# Patient Record
Sex: Female | Born: 1975 | Race: White | Hispanic: No | Marital: Married | Smoking: Never smoker
Health system: Southern US, Community
[De-identification: ages and names within clinical notes are randomized; demographics above are authoritative.]

## PROBLEM LIST (undated history)

## (undated) DIAGNOSIS — F32A Depression, unspecified: Secondary | ICD-10-CM

## (undated) DIAGNOSIS — F329 Major depressive disorder, single episode, unspecified: Secondary | ICD-10-CM

## (undated) DIAGNOSIS — R3915 Urgency of urination: Secondary | ICD-10-CM

## (undated) DIAGNOSIS — N289 Disorder of kidney and ureter, unspecified: Secondary | ICD-10-CM

## (undated) DIAGNOSIS — D219 Benign neoplasm of connective and other soft tissue, unspecified: Secondary | ICD-10-CM

## (undated) DIAGNOSIS — J189 Pneumonia, unspecified organism: Secondary | ICD-10-CM

## (undated) DIAGNOSIS — R350 Frequency of micturition: Secondary | ICD-10-CM

## (undated) DIAGNOSIS — J40 Bronchitis, not specified as acute or chronic: Secondary | ICD-10-CM

## (undated) DIAGNOSIS — Z9889 Other specified postprocedural states: Secondary | ICD-10-CM

## (undated) DIAGNOSIS — Z87442 Personal history of urinary calculi: Secondary | ICD-10-CM

## (undated) DIAGNOSIS — N201 Calculus of ureter: Secondary | ICD-10-CM

## (undated) DIAGNOSIS — R112 Nausea with vomiting, unspecified: Secondary | ICD-10-CM

## (undated) DIAGNOSIS — R011 Cardiac murmur, unspecified: Secondary | ICD-10-CM

## (undated) DIAGNOSIS — R109 Unspecified abdominal pain: Secondary | ICD-10-CM

## (undated) DIAGNOSIS — F419 Anxiety disorder, unspecified: Secondary | ICD-10-CM

## (undated) HISTORY — PX: DILATION AND CURETTAGE OF UTERUS: SHX78

## (undated) HISTORY — PX: FRACTURE SURGERY: SHX138

## (undated) HISTORY — PX: OTHER SURGICAL HISTORY: SHX169

---
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
## 2005-01-02 ENCOUNTER — Observation Stay: Payer: Self-pay | Admitting: Obstetrics and Gynecology

## 2005-04-17 ENCOUNTER — Inpatient Hospital Stay: Payer: Self-pay | Admitting: Obstetrics and Gynecology

## 2006-07-31 HISTORY — PX: OTHER SURGICAL HISTORY: SHX169

## 2007-05-27 ENCOUNTER — Ambulatory Visit: Payer: Self-pay | Admitting: Obstetrics and Gynecology

## 2008-09-15 ENCOUNTER — Ambulatory Visit: Payer: Self-pay | Admitting: Obstetrics and Gynecology

## 2008-09-16 ENCOUNTER — Inpatient Hospital Stay: Payer: Self-pay | Admitting: Obstetrics and Gynecology

## 2009-05-06 ENCOUNTER — Ambulatory Visit: Payer: Self-pay | Admitting: Urology

## 2009-05-07 ENCOUNTER — Ambulatory Visit: Payer: Self-pay | Admitting: Urology

## 2009-05-13 ENCOUNTER — Ambulatory Visit: Payer: Self-pay | Admitting: Urology

## 2009-06-16 ENCOUNTER — Ambulatory Visit: Payer: Self-pay | Admitting: Urology

## 2009-06-17 ENCOUNTER — Ambulatory Visit: Payer: Self-pay | Admitting: Urology

## 2009-06-28 ENCOUNTER — Ambulatory Visit: Payer: Self-pay | Admitting: Urology

## 2010-01-19 ENCOUNTER — Emergency Department: Payer: Self-pay | Admitting: Emergency Medicine

## 2010-07-31 HISTORY — PX: EXTRACORPOREAL SHOCK WAVE LITHOTRIPSY: SHX1557

## 2011-03-18 ENCOUNTER — Emergency Department (HOSPITAL_COMMUNITY): Payer: BC Managed Care – PPO

## 2011-03-18 ENCOUNTER — Emergency Department (HOSPITAL_COMMUNITY)
Admission: EM | Admit: 2011-03-18 | Discharge: 2011-03-19 | Disposition: A | Discharge status: Home / Self Care | Payer: BC Managed Care – PPO | Attending: Emergency Medicine | Admitting: Emergency Medicine

## 2011-03-18 DIAGNOSIS — N201 Calculus of ureter: Secondary | ICD-10-CM | POA: Insufficient documentation

## 2011-03-18 DIAGNOSIS — Z79899 Other long term (current) drug therapy: Secondary | ICD-10-CM | POA: Insufficient documentation

## 2011-03-18 DIAGNOSIS — R3911 Hesitancy of micturition: Secondary | ICD-10-CM | POA: Insufficient documentation

## 2011-03-18 DIAGNOSIS — R109 Unspecified abdominal pain: Secondary | ICD-10-CM | POA: Insufficient documentation

## 2011-03-18 DIAGNOSIS — M549 Dorsalgia, unspecified: Secondary | ICD-10-CM | POA: Insufficient documentation

## 2011-03-18 DIAGNOSIS — R112 Nausea with vomiting, unspecified: Secondary | ICD-10-CM | POA: Insufficient documentation

## 2011-03-18 LAB — URINALYSIS, ROUTINE W REFLEX MICROSCOPIC
Glucose, UA: NEGATIVE mg/dL
Leukocytes, UA: NEGATIVE
Specific Gravity, Urine: 1.026 (ref 1.005–1.030)
pH: 6 (ref 5.0–8.0)

## 2011-03-18 LAB — URINE MICROSCOPIC-ADD ON

## 2011-03-18 LAB — POCT I-STAT, CHEM 8
Calcium, Ion: 1.14 mmol/L (ref 1.12–1.32)
Chloride: 106 mEq/L (ref 96–112)
Glucose, Bld: 114 mg/dL — ABNORMAL HIGH (ref 70–99)
HCT: 36 % (ref 36.0–46.0)
Hemoglobin: 12.2 g/dL (ref 12.0–15.0)
TCO2: 25 mmol/L (ref 0–100)

## 2011-03-21 ENCOUNTER — Ambulatory Visit: Payer: Self-pay | Admitting: Urology

## 2011-03-22 ENCOUNTER — Observation Stay: Payer: Self-pay | Admitting: Urology

## 2011-03-25 ENCOUNTER — Emergency Department (HOSPITAL_COMMUNITY)
Admission: EM | Admit: 2011-03-25 | Discharge: 2011-03-25 | Disposition: A | Discharge status: Home / Self Care | Payer: BC Managed Care – PPO | Attending: Emergency Medicine | Admitting: Emergency Medicine

## 2011-03-25 ENCOUNTER — Emergency Department (HOSPITAL_COMMUNITY): Payer: BC Managed Care – PPO

## 2011-03-25 DIAGNOSIS — Z79899 Other long term (current) drug therapy: Secondary | ICD-10-CM | POA: Insufficient documentation

## 2011-03-25 DIAGNOSIS — R109 Unspecified abdominal pain: Secondary | ICD-10-CM | POA: Insufficient documentation

## 2011-03-25 DIAGNOSIS — R112 Nausea with vomiting, unspecified: Secondary | ICD-10-CM | POA: Insufficient documentation

## 2011-03-25 LAB — CBC
MCH: 30 pg (ref 26.0–34.0)
MCV: 87.1 fL (ref 78.0–100.0)
Platelets: 248 10*3/uL (ref 150–400)
RDW: 13.5 % (ref 11.5–15.5)
WBC: 6.4 10*3/uL (ref 4.0–10.5)

## 2011-03-25 LAB — BASIC METABOLIC PANEL
Calcium: 9.6 mg/dL (ref 8.4–10.5)
Chloride: 102 mEq/L (ref 96–112)
Creatinine, Ser: 1.02 mg/dL (ref 0.50–1.10)
GFR calc Af Amer: >60 mL/min (ref >60)
Sodium: 139 mEq/L (ref 135–145)

## 2011-03-25 LAB — URINALYSIS, ROUTINE W REFLEX MICROSCOPIC
Ketones, ur: 15 mg/dL — AB
Nitrite: NEGATIVE
Urobilinogen, UA: 1 mg/dL (ref 0.0–1.0)

## 2011-03-25 LAB — DIFFERENTIAL
Basophils Relative: 0 % (ref 0–1)
Eosinophils Absolute: 0.1 10*3/uL (ref 0.0–0.7)
Eosinophils Relative: 1 % (ref 0–5)
Lymphs Abs: 1.8 10*3/uL (ref 0.7–4.0)

## 2011-03-25 LAB — PREGNANCY, URINE: Preg Test, Ur: NEGATIVE

## 2011-09-22 ENCOUNTER — Ambulatory Visit: Payer: Self-pay | Admitting: Obstetrics and Gynecology

## 2011-10-06 ENCOUNTER — Ambulatory Visit: Payer: Self-pay | Admitting: Otolaryngology

## 2012-04-05 ENCOUNTER — Emergency Department: Payer: Self-pay | Admitting: Emergency Medicine

## 2012-04-05 LAB — COMPREHENSIVE METABOLIC PANEL
Alkaline Phosphatase: 83 U/L (ref 50–136)
BUN: 12 mg/dL (ref 7–18)
Bilirubin,Total: 0.8 mg/dL (ref 0.2–1.0)
Co2: 29 mmol/L (ref 21–32)
Creatinine: 0.81 mg/dL (ref 0.60–1.30)
EGFR (Non-African Amer.): >60
Osmolality: 279 (ref 275–301)
SGPT (ALT): 16 U/L (ref 12–78)
Sodium: 140 mmol/L (ref 136–145)
Total Protein: 7.2 g/dL (ref 6.4–8.2)

## 2012-04-05 LAB — URINALYSIS, COMPLETE
Bilirubin,UR: NEGATIVE
Blood: NEGATIVE
Glucose,UR: NEGATIVE mg/dL (ref 0–75)
Ketone: NEGATIVE
Nitrite: NEGATIVE
Ph: 6 (ref 4.5–8.0)
Specific Gravity: 1.018 (ref 1.003–1.030)
Squamous Epithelial: 1

## 2012-04-05 LAB — CBC
HGB: 12 g/dL (ref 12.0–16.0)
MCH: 26.5 pg (ref 26.0–34.0)
MCHC: 33 g/dL (ref 32.0–36.0)
MCV: 80 fL (ref 80–100)
Platelet: 293 10*3/uL (ref 150–440)
RBC: 4.53 10*6/uL (ref 3.80–5.20)
RDW: 15.8 % — ABNORMAL HIGH (ref 11.5–14.5)

## 2012-04-05 LAB — PREGNANCY, URINE: Pregnancy Test, Urine: NEGATIVE m[IU]/mL

## 2012-04-09 ENCOUNTER — Other Ambulatory Visit: Payer: Self-pay | Admitting: Urology

## 2012-04-09 ENCOUNTER — Encounter (HOSPITAL_BASED_OUTPATIENT_CLINIC_OR_DEPARTMENT_OTHER): Payer: Self-pay | Admitting: *Deleted

## 2012-04-09 NOTE — Progress Notes (Signed)
NPO AFTER MN WITH EXCEPTION CLEAR LIQUIDS UNTIL 0900 (NO CREAM/ MILK PRODUCTS). ARRIVES AT 1345. NEEDS HG AND URINE PREG. WILL TAKE DILAUDID AND ZOFRAN IF NEEDED W/ SIPS OF WATER AFTER 0900.

## 2012-04-10 DIAGNOSIS — N201 Calculus of ureter: Secondary | ICD-10-CM

## 2012-04-10 NOTE — H&P (Signed)
History of Present Illness  36 y/o white female with h/o nephrolithiasis presents for acute evaluation of right flank pain.  She reports sudden onset of right flank pain Friday 04/05/12.  She was evaluted at the ER and KUB demonstrated a right distal ureteral stone at the UVJ.  She has not passed the stone to her knowledge and has continued with intermittent right flank pain into the RLQ abdomen.  She also reports increased frequency, urgency with frequent, small volume voids and dysuria.  She denies gross hematuria, fever or chills.  Her prior stones have been on the left.  She has never passed a stone without intervention.  She is taking Flomax 0.4mg  po qd for MET.  Dilaudid 2mg  is controlling her pain.   Past Medical History Problems  1. History of  Bipolar Disorder 296.00 2. History of  Depression 311 3. History of  Nephrolithiasis V13.01  Surgical History Problems  1. History of  Cesarean Section 2. History of  Cesarean Section 3. History of  Cystoscopy With Ureteroscopy Left 4. History of  Dilation And Curettage 5. History of  Jaw Surgery 6. History of  Lithotripsy  Current Meds 1. HYDROmorphone HCl 2 MG Oral Tablet; Therapy: 07Sep2013 to 2. LaMICtal 200 MG Oral Tablet; TAKE 1 TABLET DAILY; Therapy: (Recorded:10Sep2012) to 3. Latuda 40 MG Oral Tablet; Therapy: 24Jun2013 to 4. Lidocaine Viscous 2 % Mouth/Throat Solution; Therapy: 29Apr2013 to 5. Ondansetron HCl 8 MG Oral Tablet; Therapy: 06Sep2013 to 6. Sertraline HCl 100 MG Oral Tablet; 1 tablet daily; Therapy: (Recorded:10Sep2012) to 7. Tamsulosin HCl 0.4 MG Oral Capsule; Therapy: 06Sep2013 to  Allergies Medication  1. Cephalexin CAPS 2. Sulfamethoxazole-TMP DS TABS 3. Amoxicillin TABS 4. Darvocet-N 100 TABS 5. Penicillins 6. Percocet TABS  Family History Problems  1. Family history of  Cancer 2. Family history of  Family Health Status Number Of Children 2 sons 3. Paternal history of  Nephrolithiasis 4. Paternal  grandfather's history of  Nephrolithiasis  Social History Problems  1. Caffeine Use 1 a day 2. Marital History - Currently Married 3. Never A Smoker 4. 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 History of  Alcohol Use 6. History of  Tobacco Use 305.1  Review of Systems  Genitourinary: urinary hesitancy and hematuria.  Gastrointestinal: nausea, vomiting, flank pain and constipation.  Constitutional: night sweats.  Musculoskeletal: back pain.  Psychiatric: depression.    Vitals Vital Signs  BMI Calculated: 23 BSA Calculated: 1.56 Height: 5 ft 2 in Weight: 125 lb  Blood Pressure: 122 / 80 Temperature: 98.2 F Heart Rate: 76  Physical Exam Constitutional: Well nourished and well developed . No acute distress.  ENT:. The ears and nose are normal in appearance.  Neck: The appearance of the neck is normal and no neck mass is present.  Pulmonary: No respiratory distress and normal respiratory rhythm and effort.  Cardiovascular: Heart rate and rhythm are normal . No peripheral edema.  Abdomen: The abdomen is soft and nontender. No masses are palpated. Mild tenderness in the LLQ is present. mild left CVA tenderness. No hernias are palpable. No hepatosplenomegaly noted.  Skin: Normal skin turgor, no visible rash and no visible skin lesions.  Neuro/Psych:. Mood and affect are appropriate.   Impression: She has a small 3 mm stone in her distal right ureter. The stone does not appear to be progressing with medical expulsive therapy and she continues to have significant pain. The treatment options were therefore discussed and she would be most appropriately managed with ureteroscopic  extraction of her stone. This procedure was therefore discussed with her in detail including its risks and complications, the alternatives, the probability of success and the anticipated postoperative course as well as the possible need for a stent postoperatively. She understands and has elected to  proceed.  Plan: She will be scheduled for right ureteroscopy and stone extraction with possible stent placement.

## 2012-04-11 ENCOUNTER — Ambulatory Visit (HOSPITAL_BASED_OUTPATIENT_CLINIC_OR_DEPARTMENT_OTHER): Admission: RE | Admit: 2012-04-11 | Payer: BC Managed Care – PPO | Source: Ambulatory Visit | Admitting: Urology

## 2012-04-11 ENCOUNTER — Encounter (HOSPITAL_BASED_OUTPATIENT_CLINIC_OR_DEPARTMENT_OTHER): Admission: RE | Payer: Self-pay | Source: Ambulatory Visit

## 2012-04-11 HISTORY — DX: Frequency of micturition: R35.0

## 2012-04-11 HISTORY — DX: Urgency of urination: R39.15

## 2012-04-11 HISTORY — DX: Unspecified abdominal pain: R10.9

## 2012-04-11 HISTORY — DX: Personal history of urinary calculi: Z87.442

## 2012-04-11 HISTORY — DX: Calculus of ureter: N20.1

## 2012-04-11 HISTORY — DX: Major depressive disorder, single episode, unspecified: F32.9

## 2012-04-11 HISTORY — DX: Nausea with vomiting, unspecified: R11.2

## 2012-04-11 HISTORY — DX: Depression, unspecified: F32.A

## 2012-04-11 HISTORY — DX: Other specified postprocedural states: Z98.890

## 2012-04-11 SURGERY — CYSTOSCOPY, WITH CALCULUS REMOVAL USING BASKET
Anesthesia: Choice | Laterality: Right

## 2012-06-02 ENCOUNTER — Emergency Department (HOSPITAL_COMMUNITY): Payer: BC Managed Care – PPO | Admitting: *Deleted

## 2012-06-02 ENCOUNTER — Encounter (HOSPITAL_COMMUNITY): Payer: Self-pay | Admitting: *Deleted

## 2012-06-02 ENCOUNTER — Encounter (HOSPITAL_COMMUNITY): Admission: EM | Disposition: A | Discharge status: Home / Self Care | Payer: Self-pay | Source: Home / Self Care

## 2012-06-02 ENCOUNTER — Emergency Department (HOSPITAL_COMMUNITY)
Admission: EM | Admit: 2012-06-02 | Discharge: 2012-06-02 | Disposition: A | Discharge status: Home / Self Care | Payer: BC Managed Care – PPO | Attending: Urology | Admitting: Urology

## 2012-06-02 ENCOUNTER — Emergency Department (HOSPITAL_COMMUNITY): Payer: BC Managed Care – PPO

## 2012-06-02 DIAGNOSIS — N390 Urinary tract infection, site not specified: Secondary | ICD-10-CM

## 2012-06-02 DIAGNOSIS — N201 Calculus of ureter: Secondary | ICD-10-CM | POA: Insufficient documentation

## 2012-06-02 DIAGNOSIS — N1330 Unspecified hydronephrosis: Secondary | ICD-10-CM | POA: Insufficient documentation

## 2012-06-02 HISTORY — PX: CYSTOSCOPY WITH RETROGRADE PYELOGRAM, URETEROSCOPY AND STENT PLACEMENT: SHX5789

## 2012-06-02 LAB — URINALYSIS, ROUTINE W REFLEX MICROSCOPIC
Glucose, UA: NEGATIVE mg/dL
Specific Gravity, Urine: 1.016 (ref 1.005–1.030)
pH: 6 (ref 5.0–8.0)

## 2012-06-02 LAB — CBC WITH DIFFERENTIAL/PLATELET
Basophils Absolute: 0.1 10*3/uL (ref 0.0–0.1)
Basophils Relative: 1 % (ref 0–1)
Eosinophils Absolute: 0.1 10*3/uL (ref 0.0–0.7)
Eosinophils Relative: 1 % (ref 0–5)
HCT: 37.4 % (ref 36.0–46.0)
Lymphocytes Relative: 16 % (ref 12–46)
MCH: 26.4 pg (ref 26.0–34.0)
MCHC: 32.9 g/dL (ref 30.0–36.0)
MCV: 80.3 fL (ref 78.0–100.0)
Monocytes Absolute: 1 10*3/uL (ref 0.1–1.0)
RDW: 16 % — ABNORMAL HIGH (ref 11.5–15.5)

## 2012-06-02 LAB — COMPREHENSIVE METABOLIC PANEL
AST: 19 U/L (ref 0–37)
CO2: 24 mEq/L (ref 19–32)
Calcium: 9.4 mg/dL (ref 8.4–10.5)
Creatinine, Ser: 0.8 mg/dL (ref 0.50–1.10)
GFR calc non Af Amer: >90 mL/min (ref >90)
Total Protein: 7 g/dL (ref 6.0–8.3)

## 2012-06-02 SURGERY — CYSTOURETEROSCOPY, WITH RETROGRADE PYELOGRAM AND STENT INSERTION
Anesthesia: General | Site: Ureter | Laterality: Right | Wound class: Clean Contaminated

## 2012-06-02 MED ORDER — ONDANSETRON HCL 4 MG/2ML IJ SOLN
INTRAMUSCULAR | Status: DC | PRN
  Administered 2012-06-02: 4 mg via INTRAVENOUS

## 2012-06-02 MED ORDER — ACETAMINOPHEN 10 MG/ML IV SOLN
INTRAVENOUS | Status: AC
  Filled 2012-06-02: qty 100

## 2012-06-02 MED ORDER — MEPERIDINE HCL 50 MG/ML IJ SOLN: 6.2500 mg | INTRAMUSCULAR | Status: DC | PRN

## 2012-06-02 MED ORDER — HYDROMORPHONE HCL PF 1 MG/ML IJ SOLN
INTRAMUSCULAR | Status: AC
  Filled 2012-06-02: qty 1

## 2012-06-02 MED ORDER — PROPOFOL 10 MG/ML IV EMUL
INTRAVENOUS | Status: DC | PRN
  Administered 2012-06-02: 200 mg via INTRAVENOUS

## 2012-06-02 MED ORDER — ONDANSETRON HCL 4 MG PO TABS: 4.0000 mg | ORAL_TABLET | ORAL | Status: DC | PRN

## 2012-06-02 MED ORDER — PROMETHAZINE HCL 25 MG/ML IJ SOLN: 6.2500 mg | INTRAMUSCULAR | Status: DC | PRN

## 2012-06-02 MED ORDER — CIPROFLOXACIN HCL 500 MG PO TABS: 500.0000 mg | ORAL_TABLET | Freq: Two times a day (BID) | ORAL | Status: DC

## 2012-06-02 MED ORDER — LIDOCAINE HCL (CARDIAC) 20 MG/ML IV SOLN
INTRAVENOUS | Status: DC | PRN
  Administered 2012-06-02: 75 mg via INTRAVENOUS

## 2012-06-02 MED ORDER — CIPROFLOXACIN IN D5W 400 MG/200ML IV SOLN
400.0000 mg | Freq: Once | INTRAVENOUS | Status: AC
  Administered 2012-06-02: 400 mg via INTRAVENOUS
  Filled 2012-06-02: qty 200

## 2012-06-02 MED ORDER — FENTANYL CITRATE 0.05 MG/ML IJ SOLN
100.0000 ug | Freq: Once | INTRAMUSCULAR | Status: AC
  Administered 2012-06-02: 100 ug via INTRAVENOUS
  Filled 2012-06-02: qty 2

## 2012-06-02 MED ORDER — CISATRACURIUM BESYLATE (PF) 10 MG/5ML IV SOLN
INTRAVENOUS | Status: DC | PRN
  Administered 2012-06-02: 8 mg via INTRAVENOUS

## 2012-06-02 MED ORDER — HYDROMORPHONE HCL 2 MG PO TABS
ORAL_TABLET | ORAL | Status: AC
  Filled 2012-06-02: qty 1

## 2012-06-02 MED ORDER — LIDOCAINE HCL 2 % EX GEL
CUTANEOUS | Status: DC | PRN
  Administered 2012-06-02: 1

## 2012-06-02 MED ORDER — LIDOCAINE HCL 2 % EX GEL
CUTANEOUS | Status: AC
  Filled 2012-06-02: qty 10

## 2012-06-02 MED ORDER — PHENAZOPYRIDINE HCL 200 MG PO TABS
ORAL_TABLET | ORAL | Status: AC
  Filled 2012-06-02: qty 1

## 2012-06-02 MED ORDER — BELLADONNA ALKALOIDS-OPIUM 16.2-60 MG RE SUPP
RECTAL | Status: DC | PRN
  Administered 2012-06-02: 1 via RECTAL

## 2012-06-02 MED ORDER — IOHEXOL 300 MG/ML  SOLN
INTRAMUSCULAR | Status: AC
  Filled 2012-06-02: qty 1

## 2012-06-02 MED ORDER — PHENAZOPYRIDINE HCL 200 MG PO TABS
200.0000 mg | ORAL_TABLET | Freq: Three times a day (TID) | ORAL | Status: AC
  Administered 2012-06-02: 200 mg via ORAL

## 2012-06-02 MED ORDER — SUCCINYLCHOLINE CHLORIDE 20 MG/ML IJ SOLN
INTRAMUSCULAR | Status: DC | PRN
  Administered 2012-06-02: 100 mg via INTRAVENOUS

## 2012-06-02 MED ORDER — SCOPOLAMINE 1 MG/3DAYS TD PT72
MEDICATED_PATCH | TRANSDERMAL | Status: DC | PRN
  Administered 2012-06-02: 1 via TRANSDERMAL

## 2012-06-02 MED ORDER — ONDANSETRON HCL 4 MG/2ML IJ SOLN
4.0000 mg | Freq: Once | INTRAMUSCULAR | Status: AC
  Administered 2012-06-02: 4 mg via INTRAVENOUS
  Filled 2012-06-02: qty 2

## 2012-06-02 MED ORDER — LACTATED RINGERS IV SOLN: INTRAVENOUS | Status: DC

## 2012-06-02 MED ORDER — ONDANSETRON HCL 4 MG/2ML IJ SOLN
INTRAMUSCULAR | Status: AC
  Filled 2012-06-02: qty 2

## 2012-06-02 MED ORDER — SENNOSIDES-DOCUSATE SODIUM 8.6-50 MG PO TABS: 1.0000 | ORAL_TABLET | Freq: Two times a day (BID) | ORAL | Status: DC

## 2012-06-02 MED ORDER — HYDROMORPHONE HCL 2 MG PO TABS: 2.0000 mg | ORAL_TABLET | ORAL | Status: DC | PRN

## 2012-06-02 MED ORDER — FENTANYL CITRATE 0.05 MG/ML IJ SOLN
INTRAMUSCULAR | Status: DC | PRN
Start: 2012-06-02 — End: 2012-06-02
  Administered 2012-06-02: 50 ug via INTRAVENOUS
  Administered 2012-06-02: 150 ug via INTRAVENOUS
  Administered 2012-06-02: 100 ug via INTRAVENOUS

## 2012-06-02 MED ORDER — BELLADONNA ALKALOIDS-OPIUM 16.2-60 MG RE SUPP
RECTAL | Status: AC
  Filled 2012-06-02: qty 1

## 2012-06-02 MED ORDER — ACETAMINOPHEN 10 MG/ML IV SOLN
INTRAVENOUS | Status: DC | PRN
  Administered 2012-06-02: 1000 mg via INTRAVENOUS

## 2012-06-02 MED ORDER — IOHEXOL 350 MG/ML SOLN: INTRAVENOUS | Status: DC | PRN

## 2012-06-02 MED ORDER — OXYBUTYNIN CHLORIDE 5 MG PO TABS
5.0000 mg | ORAL_TABLET | Freq: Four times a day (QID) | ORAL | Status: DC | PRN
Start: 2012-06-02 — End: 2014-10-01

## 2012-06-02 MED ORDER — NEOSTIGMINE METHYLSULFATE 1 MG/ML IJ SOLN
INTRAMUSCULAR | Status: DC | PRN
  Administered 2012-06-02: 5 mg via INTRAVENOUS

## 2012-06-02 MED ORDER — SODIUM CHLORIDE 0.9 % IR SOLN
Status: DC | PRN
  Administered 2012-06-02: 3000 mL via INTRAVESICAL

## 2012-06-02 MED ORDER — FENTANYL CITRATE 0.05 MG/ML IJ SOLN
INTRAMUSCULAR | Status: AC
  Filled 2012-06-02: qty 2

## 2012-06-02 MED ORDER — SCOPOLAMINE 1 MG/3DAYS TD PT72
MEDICATED_PATCH | TRANSDERMAL | Status: AC
  Filled 2012-06-02: qty 1

## 2012-06-02 MED ORDER — MIDAZOLAM HCL 5 MG/5ML IJ SOLN
INTRAMUSCULAR | Status: DC | PRN
  Administered 2012-06-02: 1 mg via INTRAVENOUS

## 2012-06-02 MED ORDER — HYDROMORPHONE HCL 2 MG PO TABS
2.0000 mg | ORAL_TABLET | Freq: Once | ORAL | Status: AC
  Administered 2012-06-02: 2 mg via ORAL

## 2012-06-02 MED ORDER — PHENAZOPYRIDINE HCL 100 MG PO TABS: 100.0000 mg | ORAL_TABLET | Freq: Three times a day (TID) | ORAL | Status: DC | PRN

## 2012-06-02 MED ORDER — GLYCOPYRROLATE 0.2 MG/ML IJ SOLN
INTRAMUSCULAR | Status: DC | PRN
  Administered 2012-06-02: .8 mg via INTRAVENOUS

## 2012-06-02 MED ORDER — ONDANSETRON HCL 4 MG/2ML IJ SOLN
4.0000 mg | Freq: Once | INTRAMUSCULAR | Status: AC
  Administered 2012-06-02: 4 mg via INTRAVENOUS

## 2012-06-02 MED ORDER — SODIUM CHLORIDE 0.9 % IV SOLN
INTRAVENOUS | Status: DC | PRN
  Administered 2012-06-02 (×2): via INTRAVENOUS

## 2012-06-02 MED ORDER — DEXAMETHASONE SODIUM PHOSPHATE 10 MG/ML IJ SOLN
INTRAMUSCULAR | Status: DC | PRN
  Administered 2012-06-02: 10 mg via INTRAVENOUS

## 2012-06-02 MED ORDER — HYDROMORPHONE HCL PF 1 MG/ML IJ SOLN
0.2500 mg | INTRAMUSCULAR | Status: DC | PRN
  Administered 2012-06-02: 0.5 mg via INTRAVENOUS
  Administered 2012-06-02 (×2): 0.25 mg via INTRAVENOUS

## 2012-06-02 MED ORDER — FENTANYL CITRATE 0.05 MG/ML IJ SOLN
50.0000 ug | Freq: Once | INTRAMUSCULAR | Status: AC
  Administered 2012-06-02: 50 ug via INTRAVENOUS
  Filled 2012-06-02: qty 2

## 2012-06-02 SURGICAL SUPPLY — 40 items
ADAPTER CATH URET PLST 4-6FR (CATHETERS) ×3 IMPLANT
BAG URO CATCHER STRL LF (DRAPE) ×6 IMPLANT
BASKET LASER NITINOL 1.9FR (BASKET) IMPLANT
BASKET STNLS GEMINI 4WIRE 3FR (BASKET) IMPLANT
BASKET ZERO TIP NITINOL 2.4FR (BASKET) ×3 IMPLANT
BRUSH URET BIOPSY 3F (UROLOGICAL SUPPLIES) IMPLANT
CATH CLEAR GEL 3F BACKSTOP (CATHETERS) IMPLANT
CATH INTERMIT  6FR 70CM (CATHETERS) IMPLANT
CATH URET 5FR 28IN CONE TIP (BALLOONS) ×1
CATH URET 5FR 28IN OPEN ENDED (CATHETERS) ×3 IMPLANT
CATH URET 5FR 70CM CONE TIP (BALLOONS) ×2 IMPLANT
CATH URET DUAL LUMEN 6-10FR 50 (CATHETERS) IMPLANT
CLOTH BEACON ORANGE TIMEOUT ST (SAFETY) ×3 IMPLANT
DRAPE CAMERA CLOSED 9X96 (DRAPES) ×3 IMPLANT
ELECT REM PT RETURN 9FT ADLT (ELECTROSURGICAL)
ELECTRODE REM PT RTRN 9FT ADLT (ELECTROSURGICAL) IMPLANT
GLOVE BIOGEL M 7.0 STRL (GLOVE) IMPLANT
GLOVE ECLIPSE 7.0 STRL STRAW (GLOVE) ×3 IMPLANT
GLOVE INDICATOR 7.5 STRL GRN (GLOVE) ×3 IMPLANT
GLOVE SURG SS PI 8.0 STRL IVOR (GLOVE) ×3 IMPLANT
GOWN PREVENTION PLUS LG XLONG (DISPOSABLE) ×3 IMPLANT
GOWN PREVENTION PLUS XLARGE (GOWN DISPOSABLE) ×3 IMPLANT
GOWN STRL REIN XL XLG (GOWN DISPOSABLE) ×3 IMPLANT
GUIDEWIRE 0.038 PTFE COATED (WIRE) IMPLANT
GUIDEWIRE ANG ZIPWIRE 038X150 (WIRE) ×3 IMPLANT
GUIDEWIRE STR DUAL SENSOR (WIRE) ×3 IMPLANT
IV NS IRRIG 3000ML ARTHROMATIC (IV SOLUTION) ×3 IMPLANT
KIT BALLIN UROMAX 15FX10 (LABEL) IMPLANT
KIT BALLN UROMAX 15FX4 (MISCELLANEOUS) IMPLANT
KIT BALLN UROMAX 26 75X4 (MISCELLANEOUS)
LASER FIBER DISP (UROLOGICAL SUPPLIES) IMPLANT
MANIFOLD NEPTUNE II (INSTRUMENTS) ×3 IMPLANT
MARKER SKIN DUAL TIP RULER LAB (MISCELLANEOUS) ×3 IMPLANT
PACK CYSTO (CUSTOM PROCEDURE TRAY) ×6 IMPLANT
SET HIGH PRES BAL DIL (LABEL)
SHEATH URET ACCESS 12FR/35CM (UROLOGICAL SUPPLIES) IMPLANT
SHEATH URET ACCESS 12FR/55CM (UROLOGICAL SUPPLIES) IMPLANT
STENT CONTOUR 6FRX24X.038 (STENTS) ×3 IMPLANT
SYRINGE IRR TOOMEY STRL 70CC (SYRINGE) IMPLANT
TUBING CONNECTING 10 (TUBING) ×3 IMPLANT

## 2012-06-02 NOTE — ED Notes (Addendum)
Pt states that she has rt sided flank pain that began about 0100. States she has an extensive hx of kidney stones. Believes this is another. Pt is actively vomiting.

## 2012-06-02 NOTE — ED Provider Notes (Addendum)
History     CSN: [id number]  Arrival date & time 06/02/12  0413   First MD Initiated Contact with Patient 06/02/12 0531      Chief Complaint  Patient presents with  . Flank Pain    right    (Consider location/radiation/quality/duration/timing/severity/associated sxs/prior treatment) HPI This is a 36 year old female with history of kidney stones. She complains of urinary tract infection symptoms for about a week. Specifically she means urinary frequency, urgency and pain with urination. She developed right flank pain this morning about 1:00. The pain was severe and characterized as like that of previous kidney stones. It was accompanied by nausea and vomiting. She denies fever or chills. She was given Zofran and fentanyl by her nurse per protocol with relief of the nausea and improvement of the pain.  Past Medical History  Diagnosis Date  . Depression   . Right ureteral stone   . History of kidney stones   . Right flank pain   . Frequency of urination   . Urgency of urination   . PONV (postoperative nausea and vomiting)     Past Surgical History  Procedure Date  . Ureteroscopic stone extractions 2003;  2001  X3;  1999  X1  . Extracorporeal shock wave lithotripsy 2012  . Dilatation and curretage with suction 2008    MISSED AB  . Cesarean section 2006  &  2009  . Orif bilateral jaw fx's 1991    History reviewed. No pertinent family history.  History  Substance Use Topics  . Smoking status: Never Smoker   . Smokeless tobacco: Never Used  . Alcohol Use: No    OB History    Grav Para Term Preterm Abortions TAB SAB Ect Mult Living                  Review of Systems  All other systems reviewed and are negative.    Allergies  Amoxil; Cephalexin; Darvocet; Penicillins; Percocet; and Septra  Home Medications   Current Outpatient Rx  Name  Route  Sig  Dispense  Refill  . HYDROMORPHONE HCL 2 MG PO TABS   Oral   Take 2 mg by mouth every 4 (four) hours as needed.  pain         . LAMOTRIGINE 200 MG PO TABS   Oral   Take 200 mg by mouth daily.         Marland Kitchen LURASIDONE HCL 40 MG PO TABS   Oral   Take 40 mg by mouth daily with breakfast.         . SERTRALINE HCL 100 MG PO TABS   Oral   Take 50 mg by mouth daily.            BP 109/65  Pulse 69  Temp 97.7 F (36.5 C) (Oral)  Resp 16  Ht 5\' 2"  (1.575 m)  Wt 130 lb (58.968 kg)  BMI 23.78 kg/m2  SpO2 96%  LMP 05/26/2012  Physical Exam General: Well-developed, well-nourished female in no acute distress; appearance consistent with age of record HENT: normocephalic, atraumatic Eyes: pupils equal round and reactive to light; extraocular muscles intact Neck: supple Heart: regular rate and rhythm Lungs: clear to auscultation bilaterally Abdomen: soft; nondistended; mild suprapubic tenderness; bowel sounds present GU: right flank tenderness Extremities: No deformity; full range of motion; pulses normal Neurologic: Awake, alert and oriented; motor function intact in all extremities and symmetric; no facial droop Skin: Warm and dry     ED Course  Procedures (including critical care time)     MDM   Nursing notes and vitals signs, including pulse oximetry, reviewed.  Summary of this visit's results, reviewed by myself:  Labs:  Results for orders placed during the hospital encounter of 06/02/12  URINALYSIS, ROUTINE W REFLEX MICROSCOPIC      Component Value Range   Color, Urine YELLOW  YELLOW   APPearance CLOUDY (*) CLEAR   Specific Gravity, Urine 1.016  1.005 - 1.030   pH 6.0  5.0 - 8.0   Glucose, UA NEGATIVE  NEGATIVE mg/dL   Hgb urine dipstick LARGE (*) NEGATIVE   Bilirubin Urine NEGATIVE  NEGATIVE   Ketones, ur NEGATIVE  NEGATIVE mg/dL   Protein, ur NEGATIVE  NEGATIVE mg/dL   Urobilinogen, UA 0.2  0.0 - 1.0 mg/dL   Nitrite POSITIVE (*) NEGATIVE   Leukocytes, UA LARGE (*) NEGATIVE  URINE MICROSCOPIC-ADD ON      Component Value Range   Squamous Epithelial / LPF RARE   RARE   WBC, UA 21-50  <3 WBC/hpf   RBC / HPF 21-50  <3 RBC/hpf   Bacteria, UA MANY (*) RARE   Urine-Other MUCOUS PRESENT    PREGNANCY, URINE      Component Value Range   Preg Test, Ur NEGATIVE  NEGATIVE  CBC WITH DIFFERENTIAL      Component Value Range   WBC 11.7 (*) 4.0 - 10.5 K/uL   RBC 4.66  3.87 - 5.11 MIL/uL   Hemoglobin 12.3  12.0 - 15.0 g/dL   HCT 54.0  98.1 - 19.1 %   MCV 80.3  78.0 - 100.0 fL   MCH 26.4  26.0 - 34.0 pg   MCHC 32.9  30.0 - 36.0 g/dL   RDW 47.8 (*) 29.5 - 62.1 %   Platelets 398  150 - 400 K/uL   Neutrophils Relative 74  43 - 77 %   Neutro Abs 8.7 (*) 1.7 - 7.7 K/uL   Lymphocytes Relative 16  12 - 46 %   Lymphs Abs 1.8  0.7 - 4.0 K/uL   Monocytes Relative 9  3 - 12 %   Monocytes Absolute 1.0  0.1 - 1.0 K/uL   Eosinophils Relative 1  0 - 5 %   Eosinophils Absolute 0.1  0.0 - 0.7 K/uL   Basophils Relative 1  0 - 1 %   Basophils Absolute 0.1  0.0 - 0.1 K/uL  COMPREHENSIVE METABOLIC PANEL      Component Value Range   Sodium 139  135 - 145 mEq/L   Potassium 3.4 (*) 3.5 - 5.1 mEq/L   Chloride 103  96 - 112 mEq/L   CO2 24  19 - 32 mEq/L   Glucose, Bld 117 (*) 70 - 99 mg/dL   BUN 15  6 - 23 mg/dL   Creatinine, Ser 3.08  0.50 - 1.10 mg/dL   Calcium 9.4  8.4 - 65.7 mg/dL   Total Protein 7.0  6.0 - 8.3 g/dL   Albumin 4.2  3.5 - 5.2 g/dL   AST 19  0 - 37 U/L   ALT 13  0 - 35 U/L   Alkaline Phosphatase 84  39 - 117 U/L   Total Bilirubin 0.4  0.3 - 1.2 mg/dL   GFR calc non Af Amer >90  >90 mL/min   GFR calc Af Amer >90  >90 mL/min  LIPASE, BLOOD      Component Value Range   Lipase 18  11 - 59 U/L  Imaging Studies: Ct Abdomen Pelvis Wo Contrast  06/02/2012  *RADIOLOGY REPORT*  Clinical Data: The right flank pain.  Urinary tract infection. History of kidney stones.  CT ABDOMEN AND PELVIS WITHOUT CONTRAST  Technique:  Multidetector CT imaging of the abdomen and pelvis was performed following the standard protocol without intravenous contrast.  Comparison:  03/25/2011  Findings: Mild dependent changes in the lung bases.  There is a 4 mm stone in the distal right ureter below the level of the sacrum.  There is proximal right ureterectasis and pyelocaliectasis with mild periureteral stranding consistent with mild to moderate obstruction.  This is new since the previous study.    There are multiple punctate sized stones demonstrated in both kidneys without obstruction.  The bladder is decompressed but there is suggestion of some bladder wall thickening which could be due to infection.  No gas in the bladder.  The unenhanced appearance of the liver, spleen, gallbladder, pancreas, adrenal glands, abdominal aorta, and retroperitoneal lymph nodes is unremarkable.  The stomach, small bowel, and colon are decompressed.  No free air or free fluid in the abdomen.  Pelvis:  The appendix is normal.  No evidence of diverticulitis. The uterus is enlarged and contains a calcified fibroid.  No abnormal adnexal masses.  No free or loculated pelvic fluid collections.  Normal alignment of the lumbar vertebrae.  IMPRESSION: 4 mm stone in the distal right ureter with moderate proximal obstruction.  Possible bladder wall thickening although the bladder is decompressed.  Multiple punctate sized nonobstructing intrarenal stones bilaterally.  The uterine fibroid.   Original Report Authenticated By: Burman Nieves, M.D.     6:29 AM Dr. Margarita Grizzle to see in ED. IV Cipro ordered.         Hanley Seamen, MD 06/02/12 0800  Hanley Seamen, MD 06/14/12 [id number]

## 2012-06-02 NOTE — Transfer of Care (Signed)
Immediate Anesthesia Transfer of Care Note  Patient: Beth Spence  Procedure(s) Performed: Procedure(s) (LRB) with comments: CYSTOSCOPY WITH RETROGRADE PYELOGRAM, URETEROSCOPY AND STENT PLACEMENT (Right)  Patient Location: PACU  Anesthesia Type:General  Level of Consciousness: awake, alert , oriented and patient cooperative  Airway & Oxygen Therapy: Patient Spontanous Breathing and Patient connected to face mask oxygen  Post-op Assessment: Report given to PACU RN, Post -op Vital signs reviewed and stable and Patient moving all extremities  Post vital signs: Reviewed and stable  Complications: No apparent anesthesia complications

## 2012-06-02 NOTE — Anesthesia Postprocedure Evaluation (Signed)
  Anesthesia Post-op Note  Patient: Beth Spence  Procedure(s) Performed: Procedure(s) (LRB): CYSTOSCOPY WITH RETROGRADE PYELOGRAM, URETEROSCOPY AND STENT PLACEMENT (Right)  Patient Location: PACU  Anesthesia Type: General  Level of Consciousness: awake and alert   Airway and Oxygen Therapy: Patient Spontanous Breathing  Post-op Pain: mild  Post-op Assessment: Post-op Vital signs reviewed, Patient's Cardiovascular Status Stable, Respiratory Function Stable, Patent Airway and No signs of Nausea or vomiting  Post-op Vital Signs: stable  Complications: No apparent anesthesia complications

## 2012-06-02 NOTE — Anesthesia Preprocedure Evaluation (Signed)
Anesthesia Evaluation  Patient identified by MRN, date of birth, ID band Patient awake    Reviewed: Allergy & Precautions, H&P , NPO status , Patient's Chart, lab work & pertinent test results  History of Anesthesia Complications (+) PONV  Airway Mallampati: II TM Distance: >3 FB Neck ROM: Full    Dental No notable dental hx.    Pulmonary neg pulmonary ROS,  breath sounds clear to auscultation  Pulmonary exam normal       Cardiovascular negative cardio ROS  Rhythm:Regular Rate:Normal     Neuro/Psych negative neurological ROS  negative psych ROS   GI/Hepatic negative GI ROS, Neg liver ROS,   Endo/Other  negative endocrine ROS  Renal/GU negative Renal ROS  negative genitourinary   Musculoskeletal negative musculoskeletal ROS (+)   Abdominal   Peds negative pediatric ROS (+)  Hematology negative hematology ROS (+)   Anesthesia Other Findings   Reproductive/Obstetrics negative OB ROS                           Anesthesia Physical Anesthesia Plan  ASA: II and emergent  Anesthesia Plan: General   Post-op Pain Management:    Induction: Intravenous  Airway Management Planned: Oral ETT  Additional Equipment:   Intra-op Plan:   Post-operative Plan: Extubation in OR  Informed Consent: I have reviewed the patients History and Physical, chart, labs and discussed the procedure including the risks, benefits and alternatives for the proposed anesthesia with the patient or authorized representative who has indicated his/her understanding and acceptance.   Dental advisory given  Plan Discussed with: CRNA  Anesthesia Plan Comments:         Anesthesia Quick Evaluation

## 2012-06-02 NOTE — ED Notes (Signed)
Patient is alert and oriented x3.  She is complaining of right flank pain that started earlier in the week. She has a history of kidney stones and states that it feels similar. Currently she her pain 10 of 10.

## 2012-06-02 NOTE — H&P (Signed)
Urology History and Physical Exam  CC: Right ureter stone  HPI: 36 year old female patient of Dr. Annabell Howells presents to the ER this morning with right-sided flank pain. This began early this morning around 1 AM. It is sharp in nature. It is located in the right flank and radiates to the right abdomen. It is associated with nausea and emesis. She does not have fever. In the ER she had a CT scan which showed bilateral nonobstructing stones. She also has a right ureter stone. His is located in the distal ureter. His 4 mm in size. It is associated with proximal moderate hydronephrosis. There is also stranding around the right kidney. The patient has a mild leukocytosis at 11 as well as pyuria with positive leukocyte esterase and nitrites.  I explained to the patient that I'm concerned that she could have a urinary tract infection. Urine was sent for culture from the ER. Due to possible infection, I have recommended surgical intervention for the stone. We discussed options which include simply placing a stent versus attempting ureteroscopy with laser lithotripsy and with stent placement.  For stent placement with or without ureteroscopy and lithotripsy I described the risks which include, but are not limited to, heart attack, stroke, pulmonary embolus, death, bleeding, infection, damage to contiguous structures, positioning injury, ureteral stricture, ureteral avulsion, ureteral injury, need for ureteral stent, inability to perform ureteroscopy, need for an interval procedure, inability to clear stone burden, stent discomfort and pain.   PMH: Past Medical History  Diagnosis Date  . Depression   . Right ureteral stone   . History of kidney stones   . Right flank pain   . Frequency of urination   . Urgency of urination   . PONV (postoperative nausea and vomiting)     PSH: Past Surgical History  Procedure Date  . Ureteroscopic stone extractions 2003;  2001  X3;  1999  X1  . Extracorporeal shock wave  lithotripsy 2012  . Dilatation and curretage with suction 2008    MISSED AB  . Cesarean section 2006  &  2009  . Orif bilateral jaw fx's 1991    Allergies: Allergies  Allergen Reactions  . Amoxil (Amoxicillin) Hives  . Cephalexin Hives and Other (See Comments)    MOUTH BLISTERS  . Darvocet (Propoxyphene-Acetaminophen) Hives  . Penicillins Hives  . Percocet (Oxycodone-Acetaminophen) Hives  . Septra (Sulfamethoxazole W-Trimethoprim) Hives and Other (See Comments)    MOUTH BLISTERS    Medications:  (Not in a hospital admission)   Social History: History   Social History  . Marital Status: Married    Spouse Name: N/A    Number of Children: N/A  . Years of Education: N/A   Occupational History  . Not on file.   Social History Main Topics  . Smoking status: Never Smoker   . Smokeless tobacco: Never Used  . Alcohol Use: No  . Drug Use: No  . Sexually Active:    Other Topics Concern  . Not on file   Social History Narrative  . No narrative on file    Family History: History reviewed. No pertinent family history.  Review of Systems: Positive: Flank pain, nausea, emesis. Negative: Fever, chest pain, SOB.  A further 10 point review of systems was negative except what is listed in the HPI.  Physical Exam: Filed Vitals:   06/02/12 0620  BP: 109/65  Pulse: 69  Temp: 97.7 F (36.5 C)  Resp: 16    General: No acute distress.  Awake. Head:  Normocephalic.  Atraumatic. ENT:  EOMI.  Mucous membranes moist Neck:  Supple.  No lymphadenopathy. CV:  S1 present. S2 present. Regular rate. Pulmonary: Equal effort bilaterally.  Clear to auscultation bilaterally. Abdomen: Soft.  Non- tender to palpation. Skin:  Normal turgor.  No visible rash. Extremity: No gross deformity of bilateral upper extremities.  No gross deformity of    bilateral lower extremities. Neurologic: Alert. Appropriate mood.    Studies:  Recent Labs  [facility] 06/02/12 0520   HGB 12.3   WBC  11.7*   PLT 398    Recent Labs  Basename 06/02/12 0520   NA 139   K 3.4*   CL 103   CO2 24   BUN 15   CREATININE 0.80   CALCIUM 9.4   GFRNONAA >90   GFRAA >90     No results found for this basename: PT:2,INR:2,APTT:2 in the last 72 hours   No components found with this basename: ABG:2    Assessment:  Right distal ureter stone with obstruction and possible urinary tract infection.  Plan: She wishes to proceed to the OR for cystoscopy, possible right retrograde pyelogram, possible right ureteroscopy, laser lithotripsy, and right ureter stent placement. She understands that if the stone is impacted I will simply place a stent and she will have to have an interval ureteroscopy.  Informed consent was obtained today.

## 2012-06-02 NOTE — Preoperative (Signed)
Beta Blockers   Reason not to administer Beta Blockers:Not Applicable 

## 2012-06-02 NOTE — Op Note (Addendum)
Urology operative report  Preoperative diagnosis: Right distal ureter stone Postoperative diagnosis: Rightt distal ureter stone  Date of surgery: 06/02/12  Surgeon: Natalia Leatherwood, M.D.  Assistants: None  Procedures: Cystoscopy Right ureteroscopy Basket stone retrieval Right ureter stent placement  Drains: None  Specimen: Stone sent to [facility] for chemical analysis.  Estimated blood loss: None  Complications: None  Findings: Right distal ureter stone. Edema of the distal ureter beyond the stone.  History of present illness: 36 year old female patient followed by Dr. Annabell Howells in my practice for a history of nephrolithiasis. She presented to the ER today with a right distal ureter stone. This was approximately 4 mm in size. Was associated with hydronephrosis. Her urine was concerning for infection and she had leukocytosis. She did not have fever. We discussed management options and she elected to proceed with ureteroscopy and stent placement.  Procedure:  After informed consent was obtained the patient was taken to the operating room where she was placed in a supine position. IV antibiotics had been infused in the ER, this was ciprofloxacin. Gen. anesthesia was induced. She was placed in a dorsal lithotomy position making sure to pad all pertinent neurovascular pressure points appropriately. Her genitals were then prepped and draped in the usual sterile fashion. A timeout was performed in which the correct patient, surgical site, and procedure were identified and agreed upon by the team.  A rigid cystoscope was advanced through the urethra into the bladder. The bladder was drained. It was then fully distended and evaluated in a systematic fashion. There were no bladder tumors noted. Attention was turned to the right ureter orifice. It was cannulated with a sensor tip wire. This was placed up into the right renal pelvis with a good curl noted on fluoroscopy. Paralysis was  induced. A semirigid ureteroscope was then advanced through the urethra and into the right ureter. It was noted to be edema and the distal right ureter. An obstructed stone was then visible. This was grasped with a 0 tip Nitinol basket and removed with ease. The safety wire was loaded back through the cystoscope and a 6 x 24 double-J ureter stent was placed into the ureter with a good curl noted in the right renal pelvis on fluoroscopy. It also had a good curl in the bladder. The strings were removed from the stent. The wire was removed, the bladder was drained, 10 cc of lidocaine jelly were placed into the urethra, and a belladonna and opium suppository was placed into her rectum.  This completed the procedure. Anesthesia was reversed and she was taken to the PACU in stable condition.

## 2012-06-02 NOTE — Progress Notes (Signed)
pacu nursing:  Scop patch noted behind right ear upon arrival to pacu.

## 2012-06-03 ENCOUNTER — Encounter (HOSPITAL_COMMUNITY): Payer: Self-pay | Admitting: Urology

## 2012-06-04 LAB — URINE CULTURE: Colony Count: >=100000

## 2014-09-25 ENCOUNTER — Emergency Department (HOSPITAL_COMMUNITY): Payer: BLUE CROSS/BLUE SHIELD

## 2014-09-25 ENCOUNTER — Encounter (HOSPITAL_COMMUNITY): Payer: Self-pay

## 2014-09-25 ENCOUNTER — Emergency Department (HOSPITAL_COMMUNITY)
Admission: EM | Admit: 2014-09-25 | Discharge: 2014-09-25 | Disposition: A | Discharge status: Home / Self Care | Payer: BLUE CROSS/BLUE SHIELD | Attending: Emergency Medicine | Admitting: Emergency Medicine

## 2014-09-25 DIAGNOSIS — Z880 Allergy status to penicillin: Secondary | ICD-10-CM | POA: Diagnosis not present

## 2014-09-25 DIAGNOSIS — F329 Major depressive disorder, single episode, unspecified: Secondary | ICD-10-CM | POA: Diagnosis not present

## 2014-09-25 DIAGNOSIS — Z87442 Personal history of urinary calculi: Secondary | ICD-10-CM | POA: Insufficient documentation

## 2014-09-25 DIAGNOSIS — R109 Unspecified abdominal pain: Secondary | ICD-10-CM | POA: Diagnosis present

## 2014-09-25 DIAGNOSIS — Z79899 Other long term (current) drug therapy: Secondary | ICD-10-CM | POA: Diagnosis not present

## 2014-09-25 DIAGNOSIS — N132 Hydronephrosis with renal and ureteral calculous obstruction: Secondary | ICD-10-CM

## 2014-09-25 DIAGNOSIS — N131 Hydronephrosis with ureteral stricture, not elsewhere classified: Secondary | ICD-10-CM | POA: Diagnosis not present

## 2014-09-25 LAB — URINALYSIS, ROUTINE W REFLEX MICROSCOPIC
BILIRUBIN URINE: NEGATIVE
Glucose, UA: NEGATIVE mg/dL
HGB URINE DIPSTICK: NEGATIVE
Ketones, ur: NEGATIVE mg/dL
Leukocytes, UA: NEGATIVE
Nitrite: NEGATIVE
Protein, ur: NEGATIVE mg/dL
SPECIFIC GRAVITY, URINE: 1.01 (ref 1.005–1.030)
UROBILINOGEN UA: 0.2 mg/dL (ref 0.0–1.0)
pH: 6.5 (ref 5.0–8.0)

## 2014-09-25 LAB — I-STAT BETA HCG BLOOD, ED (MC, WL, AP ONLY)

## 2014-09-25 LAB — CBC WITH DIFFERENTIAL/PLATELET
Basophils Absolute: 0.1 10*3/uL (ref 0.0–0.1)
Basophils Relative: 1 % (ref 0–1)
EOS ABS: 0.1 10*3/uL (ref 0.0–0.7)
EOS PCT: 1 % (ref 0–5)
HEMATOCRIT: 40.3 % (ref 36.0–46.0)
Hemoglobin: 13 g/dL (ref 12.0–15.0)
LYMPHS ABS: 2 10*3/uL (ref 0.7–4.0)
LYMPHS PCT: 28 % (ref 12–46)
MCH: 27.7 pg (ref 26.0–34.0)
MCHC: 32.3 g/dL (ref 30.0–36.0)
MCV: 85.7 fL (ref 78.0–100.0)
Monocytes Absolute: 0.5 10*3/uL (ref 0.1–1.0)
Monocytes Relative: 7 % (ref 3–12)
Neutro Abs: 4.5 10*3/uL (ref 1.7–7.7)
Neutrophils Relative %: 63 % (ref 43–77)
Platelets: 303 10*3/uL (ref 150–400)
RBC: 4.7 MIL/uL (ref 3.87–5.11)
RDW: 15.4 % (ref 11.5–15.5)
WBC: 7.2 10*3/uL (ref 4.0–10.5)

## 2014-09-25 LAB — COMPREHENSIVE METABOLIC PANEL
ALT: 16 U/L (ref 0–35)
AST: 23 U/L (ref 0–37)
Albumin: 4.7 g/dL (ref 3.5–5.2)
Alkaline Phosphatase: 67 U/L (ref 39–117)
Anion gap: 8 (ref 5–15)
BUN: 10 mg/dL (ref 6–23)
CO2: 26 mmol/L (ref 19–32)
Calcium: 9.4 mg/dL (ref 8.4–10.5)
Chloride: 108 mmol/L (ref 96–112)
Creatinine, Ser: 0.71 mg/dL (ref 0.50–1.10)
GFR calc Af Amer: >90 mL/min (ref >90)
GFR calc non Af Amer: >90 mL/min (ref >90)
Glucose, Bld: 105 mg/dL — ABNORMAL HIGH (ref 70–99)
POTASSIUM: 3.5 mmol/L (ref 3.5–5.1)
SODIUM: 142 mmol/L (ref 135–145)
TOTAL PROTEIN: 7.5 g/dL (ref 6.0–8.3)
Total Bilirubin: 1.1 mg/dL (ref 0.3–1.2)

## 2014-09-25 LAB — LIPASE, BLOOD: Lipase: 26 U/L (ref 11–59)

## 2014-09-25 MED ORDER — KETOROLAC TROMETHAMINE 30 MG/ML IJ SOLN
30.0000 mg | Freq: Once | INTRAMUSCULAR | Status: AC
  Administered 2014-09-25: 30 mg via INTRAVENOUS
  Filled 2014-09-25: qty 1

## 2014-09-25 MED ORDER — KETOROLAC TROMETHAMINE 30 MG/ML IJ SOLN: 30.0000 mg | Freq: Once | INTRAMUSCULAR | Status: DC

## 2014-09-25 MED ORDER — HYDROMORPHONE HCL 2 MG PO TABS: 4.0000 mg | ORAL_TABLET | ORAL | Status: DC | PRN

## 2014-09-25 MED ORDER — HYDROMORPHONE HCL 1 MG/ML IJ SOLN
1.0000 mg | Freq: Once | INTRAMUSCULAR | Status: AC
  Administered 2014-09-25: 1 mg via INTRAVENOUS
  Filled 2014-09-25: qty 1

## 2014-09-25 MED ORDER — SODIUM CHLORIDE 0.9 % IV SOLN
1000.0000 mL | Freq: Once | INTRAVENOUS | Status: AC
  Administered 2014-09-25: 1000 mL via INTRAVENOUS

## 2014-09-25 MED ORDER — SODIUM CHLORIDE 0.9 % IV SOLN
1000.0000 mL | INTRAVENOUS | Status: DC
  Administered 2014-09-25: 1000 mL via INTRAVENOUS

## 2014-09-25 MED ORDER — ONDANSETRON HCL 4 MG PO TABS: 4.0000 mg | ORAL_TABLET | Freq: Four times a day (QID) | ORAL | Status: DC

## 2014-09-25 NOTE — Discharge Instructions (Signed)

## 2014-09-25 NOTE — ED Notes (Signed)
Pt given Dilaudid 1 mg IV at 1845, pt currently unable to be dc. Pt will be driving herself home.

## 2014-09-25 NOTE — ED Notes (Signed)
Pt presents with c/o left side flank pain. Hx of kidney stones. Pt denies N/V at this time. Denies hematuria at this time.

## 2014-09-25 NOTE — ED Provider Notes (Addendum)
CSN: [id number]     Arrival date & time 09/25/14  1551 History   First MD Initiated Contact with Patient 09/25/14 1556     Chief Complaint  Patient presents with  . Flank Pain   HPI The patient presents to the emergency room with complaints of left-sided flank pain. Symptoms started today. She has a history of kidney stones and has had 12 stones in the past. She's had to have multiple procedures in the past for her ureteral stones. 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 When she started developing her left flank pain radiating anteriorly are similar to her prior kidney stone she called her urologist today. The office was closing and they instructed her to come to the emergency room. The recommended a CT scan considering her numerous prior procedures and the fact that she will most likely require another one if she has a recurrent kidney stone. Patient's pain right now is mild. Eyes any vomiting or fever. No diarrhea or any other complaints. Past Medical History  Diagnosis Date  . Depression   . Right ureteral stone   . History of kidney stones   . Right flank pain   . Frequency of urination   . Urgency of urination   . PONV (postoperative nausea and vomiting)    Past Surgical History  Procedure Laterality Date  . Ureteroscopic stone extractions  2003;  2001  X3;  1999  X1  . Extracorporeal shock wave lithotripsy  2012  . Dilatation and curretage with suction  2008    MISSED AB  . Cesarean section  2006  &  2009  . Orif bilateral jaw fx's  1991  . Cystoscopy with retrograde pyelogram, ureteroscopy and stent placement  06/02/2012    Procedure: CYSTOSCOPY WITH RETROGRADE PYELOGRAM, URETEROSCOPY AND STENT PLACEMENT;  Surgeon: Molli Hazard, MD;  Location: [facility];  Service: Urology;  Laterality: Right;   No family history on file. History  Substance Use Topics  . Smoking status: Never Smoker   . Smokeless  tobacco: Never Used  . Alcohol Use: No   OB History    No data available     Review of Systems  All other systems reviewed and are negative.     Allergies  Amoxil; Cephalexin; Darvocet; Penicillins; Percocet; and Septra  Home Medications   Prior to Admission medications   Medication Sig Start Date End Date Taking? Authorizing Provider  lamoTRIgine (LAMICTAL) 200 MG tablet Take 200 mg by mouth at bedtime.    Yes Historical Provider, MD  QUEtiapine (SEROQUEL) 100 MG tablet Take 100 mg by mouth at bedtime.   Yes Historical Provider, MD  ciprofloxacin (CIPRO) 500 MG tablet Take 1 tablet (500 mg total) by mouth 2 (two) times daily. Patient not taking: Reported on 09/25/2014 06/02/12   Sharyn Creamer, MD  HYDROmorphone (DILAUDID) 2 MG tablet Take 2 tablets (4 mg total) by mouth every 4 (four) hours as needed for severe pain. 09/25/14   Dorie Rank, MD  ondansetron (ZOFRAN) 4 MG tablet Take 1 tablet (4 mg total) by mouth every 6 (six) hours. 09/25/14   Dorie Rank, MD  oxybutynin (DITROPAN) 5 MG tablet Take 1 tablet (5 mg total) by mouth every 6 (six) hours as needed (bladder spasms). Patient not taking: Reported on 09/25/2014 06/02/12   Sharyn Creamer, MD  phenazopyridine (PYRIDIUM) 100 MG tablet Take 1 tablet (100 mg total) by mouth every 8 (  eight) hours as needed for pain (Burning urination.  Will turn urine and body fluids orange.). Patient not taking: Reported on 09/25/2014 06/02/12   Sharyn Creamer, MD  senna-docusate (SENOKOT S) 8.6-50 MG per tablet Take 1 tablet by mouth 2 (two) times daily. Patient not taking: Reported on 09/25/2014 06/02/12   Sharyn Creamer, MD   BP 139/88 mmHg  Pulse 84  Temp(Src) 98.4 F (36.9 C) (Oral)  Resp 20  SpO2 96%  LMP 08/31/2014 Physical Exam  Constitutional: She appears well-developed and well-nourished. No distress.  HENT:  Head: Normocephalic and atraumatic.  Right Ear: External ear normal.  Left Ear: External ear normal.  Eyes:  Conjunctivae are normal. Right eye exhibits no discharge. Left eye exhibits no discharge. No scleral icterus.  Neck: Neck supple. No tracheal deviation present.  Cardiovascular: Normal rate, regular rhythm and intact distal pulses.   Pulmonary/Chest: Effort normal and breath sounds normal. No stridor. No respiratory distress. She has no wheezes. She has no rales.  Abdominal: Soft. Bowel sounds are normal. She exhibits no distension. There is no tenderness. There is no rebound and no guarding.  Musculoskeletal: She exhibits no edema or tenderness.  Neurological: She is alert. She has normal strength. No cranial nerve deficit (no facial droop, extraocular movements intact, no slurred speech) or sensory deficit. She exhibits normal muscle tone. She displays no seizure activity. Coordination normal.  Skin: Skin is warm and dry. No rash noted.  Psychiatric: She has a normal mood and affect.  Nursing note and vitals reviewed.   ED Course  Procedures (including critical care time) Labs Review Labs Reviewed  COMPREHENSIVE METABOLIC PANEL - Abnormal; Notable for the following:    Glucose, Bld 105 (*)    All other components within normal limits  CBC WITH DIFFERENTIAL/PLATELET  LIPASE, BLOOD  URINALYSIS, ROUTINE W REFLEX MICROSCOPIC  I-STAT BETA HCG BLOOD, ED ([facility], [facility], [facility] ONLY)    Imaging Review Ct Abdomen Pelvis Wo Contrast  09/25/2014   CLINICAL DATA:  Left flank pain. History of renal calculi. History of renal calculi at of  EXAM: CT ABDOMEN AND PELVIS WITHOUT CONTRAST  TECHNIQUE: Multidetector CT imaging of the abdomen and pelvis was performed following the standard protocol without IV contrast.  COMPARISON:  Multiple exams, including 05/19/2014 and 06/02/2012  FINDINGS: Lower chest: Linear subsegmental atelectasis in the posterior basal segment left lower lobe.  Hepatobiliary: Hypodense 0.9 by 0.7 cm lesion in segment 4b of the liver, image 17 series 2, no change from 06/02/2012.  Pancreas:  Unremarkable  Spleen: Unremarkable  Adrenals/Urinary Tract: Increased fullness of the left collecting system without overt hydronephrosis; mild left hydroureter extending down to the distal ureter. There is a new 3 mm calculus in the vicinity of the left UVJ and also a very faint punctate calculus near the left distal ureter measuring approximately 1 mm in diameter, on images 71 and 68 of series 2, respectively. Both of these are difficult to separate from the distal ureter, and given the hydroureter I am suspicious that 1 or both represent partially obstructive calculi.  Possible 1 mm right kidney lower pole nonobstructive calculus, image 55 series 3.  Stomach/Bowel: Prominent stool throughout the colon favors constipation.  Vascular/Lymphatic: Unremarkable  Reproductive: 3.3 by 2.7 cm densely calcified left fundal intramural fibroid.  Other: No supplemental non-categorized findings.  Musculoskeletal: Unremarkable  IMPRESSION: 1. New mild left hydroureter and slight fullness of left collecting system without overt hydronephrosis. Two new calcifications in the vicinity of the left distal  ureter in difficult to separate from the distal ureter. These could conceivably be phleboliths, particularly given lack of a definitive tissue rim sign, but I am suspicious that one or both may be within the distal ureter/UVJ. 2. 1 mm nonobstructive calculus in the right kidney lower pole. 3.  Prominent stool throughout the colon favors constipation. 4. Stable 3.3 cm densely calcified left fundal intramural fibroid of the uterus. 5. Stable 9 mm probable cyst in segment 4b of the liver.   Electronically Signed   By: Van Clines M.D.   On: 09/25/2014 17:52   Medications  0.9 %  sodium chloride infusion (1,000 mLs Intravenous New Bag/Given 09/25/14 1644)    Followed by  0.9 %  sodium chloride infusion (1,000 mLs Intravenous New Bag/Given 09/25/14 1644)  HYDROmorphone (DILAUDID) injection 1 mg (1 mg Intravenous Given 09/25/14  1845)     MDM   Final diagnoses:  Ureteral stone with hydronephrosis    CT scan shows probable ureteral stone.  Pt does have a history of same.  No sign of UTI at this time.  Will hold off on abx. Pt knows to follow up with urology closely.  Call them for any recurrent symptoms    Dorie Rank, MD 09/25/14 [phone]  Discussed with Dr Alger Simons.  Suggested tamsulosin but patient has a sulfa allegy  Dorie Rank, MD 09/25/14 2030

## 2014-09-25 NOTE — Progress Notes (Signed)
  CARE MANAGEMENT ED NOTE 09/25/2014  Patient:  Beth Spence, Beth Spence   Account Number:  [id number]  Date Initiated:  09/25/2014  Documentation initiated by:  Livia Snellen  Subjective/Objective Assessment:   Patient presents to ED with left flank pain     Subjective/Objective Assessment Detail:   Patient with pmhx of kidney stone, depression     Action/Plan:   Action/Plan Detail:   Anticipated DC Date:  09/25/2014     Status Recommendation to Physician:   Result of Recommendation:    Other ED [place]  Other  PCP issues    Choice offered to / List presented to:            Status of service:  Completed, signed off  ED Comments:   ED Comments Detail:  EDCM spoke to patient at bedside.  Patient confirms she has [employer] without a pcp.  EDCM offered patient  a list of pcps who accept [employer] insurnace but patient refused. Patient stated, "I really don't need one.  I go to my yearly physical at [facility]."  [facility] asked patient where does nshe go when she gets sick?  Patient reports she will go to  a walk in clinic or an urgent care.  EDCM discussed importance and purpose of having a pcp.  Patient still refused list.  EDCM strongly encouraged patient to find a pcp and if she changed her mind, to call the phone number on the back of her insurnace card to help her find a doctor who is close to her and within network.  Patient verbalized understanding.  No further EDCM needs at this time.

## 2014-09-26 ENCOUNTER — Emergency Department (HOSPITAL_COMMUNITY)
Admission: EM | Admit: 2014-09-26 | Discharge: 2014-09-26 | Disposition: A | Discharge status: Home / Self Care | Payer: BLUE CROSS/BLUE SHIELD | Attending: Emergency Medicine | Admitting: Emergency Medicine

## 2014-09-26 ENCOUNTER — Encounter (HOSPITAL_COMMUNITY): Payer: Self-pay | Admitting: Emergency Medicine

## 2014-09-26 DIAGNOSIS — Z79899 Other long term (current) drug therapy: Secondary | ICD-10-CM | POA: Diagnosis not present

## 2014-09-26 DIAGNOSIS — Z880 Allergy status to penicillin: Secondary | ICD-10-CM | POA: Insufficient documentation

## 2014-09-26 DIAGNOSIS — F329 Major depressive disorder, single episode, unspecified: Secondary | ICD-10-CM | POA: Diagnosis not present

## 2014-09-26 DIAGNOSIS — N200 Calculus of kidney: Secondary | ICD-10-CM | POA: Diagnosis present

## 2014-09-26 DIAGNOSIS — Z792 Long term (current) use of antibiotics: Secondary | ICD-10-CM | POA: Diagnosis not present

## 2014-09-26 DIAGNOSIS — N23 Unspecified renal colic: Secondary | ICD-10-CM | POA: Diagnosis not present

## 2014-09-26 LAB — I-STAT CHEM 8, ED
BUN: 7 mg/dL (ref 6–23)
CALCIUM ION: 1.18 mmol/L (ref 1.12–1.23)
Chloride: 105 mmol/L (ref 96–112)
Creatinine, Ser: 0.6 mg/dL (ref 0.50–1.10)
Glucose, Bld: 99 mg/dL (ref 70–99)
HEMATOCRIT: 43 % (ref 36.0–46.0)
HEMOGLOBIN: 14.6 g/dL (ref 12.0–15.0)
POTASSIUM: 4.6 mmol/L (ref 3.5–5.1)
SODIUM: 140 mmol/L (ref 135–145)
TCO2: 21 mmol/L (ref 0–100)

## 2014-09-26 MED ORDER — ONDANSETRON HCL 4 MG/2ML IJ SOLN
INTRAMUSCULAR | Status: AC
  Administered 2014-09-26: 4 mg via INTRAVENOUS
  Filled 2014-09-26: qty 2

## 2014-09-26 MED ORDER — ONDANSETRON HCL 4 MG/2ML IJ SOLN
4.0000 mg | Freq: Once | INTRAMUSCULAR | Status: AC
Start: 2014-09-26 — End: 2014-09-26
  Administered 2014-09-26: 4 mg via INTRAVENOUS

## 2014-09-26 MED ORDER — METOCLOPRAMIDE HCL 5 MG/ML IJ SOLN
10.0000 mg | Freq: Once | INTRAMUSCULAR | Status: AC
  Administered 2014-09-26: 10 mg via INTRAVENOUS
  Filled 2014-09-26: qty 2

## 2014-09-26 MED ORDER — SODIUM CHLORIDE 0.9 % IV BOLUS (SEPSIS)
1000.0000 mL | Freq: Once | INTRAVENOUS | Status: AC
  Administered 2014-09-26: 1000 mL via INTRAVENOUS

## 2014-09-26 MED ORDER — FENTANYL CITRATE 0.05 MG/ML IJ SOLN
50.0000 ug | Freq: Once | INTRAMUSCULAR | Status: AC
  Administered 2014-09-26: 50 ug via INTRAVENOUS

## 2014-09-26 MED ORDER — ONDANSETRON HCL 4 MG/2ML IJ SOLN
4.0000 mg | Freq: Once | INTRAMUSCULAR | Status: AC
  Administered 2014-09-26: 4 mg via INTRAVENOUS
  Filled 2014-09-26: qty 2

## 2014-09-26 MED ORDER — FENTANYL CITRATE 0.05 MG/ML IJ SOLN
INTRAMUSCULAR | Status: AC
  Administered 2014-09-26: 50 ug via INTRAVENOUS
  Filled 2014-09-26: qty 2

## 2014-09-26 MED ORDER — HYDROMORPHONE HCL 1 MG/ML IJ SOLN
1.0000 mg | Freq: Once | INTRAMUSCULAR | Status: AC
  Administered 2014-09-26: 1 mg via INTRAVENOUS
  Filled 2014-09-26: qty 1

## 2014-09-26 MED ORDER — METOCLOPRAMIDE HCL 10 MG PO TABS: 10.0000 mg | ORAL_TABLET | Freq: Four times a day (QID) | ORAL | Status: DC | PRN

## 2014-09-26 NOTE — ED Provider Notes (Signed)
CSN: [id number]     Arrival date & time 09/26/14  1326 History   First MD Initiated Contact with Patient 09/26/14 1422     Chief Complaint  Patient presents with  . Nephrolithiasis     (Consider location/radiation/quality/duration/timing/severity/associated sxs/prior Treatment) HPI Results with left-sided flank pain started yesterday. Seen here yesterday diagnosed with ureteral colic. She presents today as she's had multiple episodes of vomiting since being discharged from here yesterday. She is been unable to hold down her pain medicine without vomiting. However presently she is pain-free. She's been treating herself with Zofran however continues to vomit. Her urologist also phoned in Compazine suppositories which she tried without improvement of nausea or vomiting. Other associated symptoms include generalized weakness and feels dehydrated. Nothing makes symptoms better or worse. Presently complains of nausea. Pain is minimal since treatment here prior to my exam Past Medical History  Diagnosis Date  . Depression   . Right ureteral stone   . History of kidney stones   . Right flank pain   . Frequency of urination   . Urgency of urination   . PONV (postoperative nausea and vomiting)    Past Surgical History  Procedure Laterality Date  . Ureteroscopic stone extractions  2003;  2001  X3;  1999  X1  . Extracorporeal shock wave lithotripsy  2012  . Dilatation and curretage with suction  2008    MISSED AB  . Cesarean section  2006  &  2009  . Orif bilateral jaw fx's  1991  . Cystoscopy with retrograde pyelogram, ureteroscopy and stent placement  06/02/2012    Procedure: CYSTOSCOPY WITH RETROGRADE PYELOGRAM, URETEROSCOPY AND STENT PLACEMENT;  Surgeon: Molli Hazard, MD;  Location: [facility];  Service: Urology;  Laterality: Right;   No family history on file. History  Substance Use Topics  . Smoking status: Never Smoker   . Smokeless tobacco: Never Used  . Alcohol Use: No   OB  History    No data available     Review of Systems  Constitutional: Negative.   HENT: Negative.   Respiratory: Negative.   Cardiovascular: Negative.   Gastrointestinal: Positive for nausea and vomiting.  Genitourinary: Positive for flank pain.  Musculoskeletal: Negative.   Skin: Negative.   Neurological: Negative.   Psychiatric/Behavioral: Negative.   All other systems reviewed and are negative.     Allergies  Amoxil; Cephalexin; Darvocet; Penicillins; Percocet; and Septra  Home Medications   Prior to Admission medications   Medication Sig Start Date End Date Taking? Authorizing Provider  HYDROmorphone (DILAUDID) 2 MG tablet Take 2 tablets (4 mg total) by mouth every 4 (four) hours as needed for severe pain. 09/25/14  Yes Dorie Rank, MD  lamoTRIgine (LAMICTAL) 200 MG tablet Take 200 mg by mouth at bedtime.    Yes Historical Provider, MD  ondansetron (ZOFRAN) 4 MG tablet Take 1 tablet (4 mg total) by mouth every 6 (six) hours. 09/25/14  Yes Dorie Rank, MD  QUEtiapine (SEROQUEL) 100 MG tablet Take 100 mg by mouth at bedtime.   Yes Historical Provider, MD  ciprofloxacin (CIPRO) 500 MG tablet Take 1 tablet (500 mg total) by mouth 2 (two) times daily. Patient not taking: Reported on 09/25/2014 06/02/12   Sharyn Creamer, MD  oxybutynin (DITROPAN) 5 MG tablet Take 1 tablet (5 mg total) by mouth every 6 (six) hours as needed (bladder spasms). Patient not taking: Reported on 09/25/2014 06/02/12   Sharyn Creamer, MD  phenazopyridine (PYRIDIUM) 100 MG tablet Take  1 tablet (100 mg total) by mouth every 8 (eight) hours as needed for pain (Burning urination.  Will turn urine and body fluids orange.). Patient not taking: Reported on 09/25/2014 06/02/12   Sharyn Creamer, MD  senna-docusate (SENOKOT S) 8.6-50 MG per tablet Take 1 tablet by mouth 2 (two) times daily. Patient not taking: Reported on 09/25/2014 06/02/12   Sharyn Creamer, MD   BP 136/87 mmHg  Pulse 68  Temp(Src) 98 F (36.7 C)  (Oral)  Resp 18  SpO2 100%  LMP 08/31/2014 Physical Exam  Constitutional: She appears well-developed and well-nourished.  HENT:  Head: Normocephalic and atraumatic.  Eyes: Conjunctivae are normal. Pupils are equal, round, and reactive to light.  Neck: Neck supple. No tracheal deviation present. No thyromegaly present.  Cardiovascular: Normal rate and regular rhythm.   No murmur heard. Pulmonary/Chest: Effort normal and breath sounds normal.  Abdominal: Soft. Bowel sounds are normal. She exhibits no distension. There is no tenderness.  Genitourinary:  Mild left flank tenderness  Musculoskeletal: Normal range of motion. She exhibits no edema or tenderness.  Neurological: She is alert. Coordination normal.  Skin: Skin is warm and dry. No rash noted.  Psychiatric: She has a normal mood and affect.  Nursing note and vitals reviewed.   ED Course  Procedures (including critical care time) Labs Review Labs Reviewed  I-STAT CHEM 8, ED    Imaging Review Ct Abdomen Pelvis Wo Contrast  09/25/2014   CLINICAL DATA:  Left flank pain. History of renal calculi. History of renal calculi at of  EXAM: CT ABDOMEN AND PELVIS WITHOUT CONTRAST  TECHNIQUE: Multidetector CT imaging of the abdomen and pelvis was performed following the standard protocol without IV contrast.  COMPARISON:  Multiple exams, including 05/19/2014 and 06/02/2012  FINDINGS: Lower chest: Linear subsegmental atelectasis in the posterior basal segment left lower lobe.  Hepatobiliary: Hypodense 0.9 by 0.7 cm lesion in segment 4b of the liver, image 17 series 2, no change from 06/02/2012.  Pancreas: Unremarkable  Spleen: Unremarkable  Adrenals/Urinary Tract: Increased fullness of the left collecting system without overt hydronephrosis; mild left hydroureter extending down to the distal ureter. There is a new 3 mm calculus in the vicinity of the left UVJ and also a very faint punctate calculus near the left distal ureter measuring  approximately 1 mm in diameter, on images 71 and 68 of series 2, respectively. Both of these are difficult to separate from the distal ureter, and given the hydroureter I am suspicious that 1 or both represent partially obstructive calculi.  Possible 1 mm right kidney lower pole nonobstructive calculus, image 55 series 3.  Stomach/Bowel: Prominent stool throughout the colon favors constipation.  Vascular/Lymphatic: Unremarkable  Reproductive: 3.3 by 2.7 cm densely calcified left fundal intramural fibroid.  Other: No supplemental non-categorized findings.  Musculoskeletal: Unremarkable  IMPRESSION: 1. New mild left hydroureter and slight fullness of left collecting system without overt hydronephrosis. Two new calcifications in the vicinity of the left distal ureter in difficult to separate from the distal ureter. These could conceivably be phleboliths, particularly given lack of a definitive tissue rim sign, but I am suspicious that one or both may be within the distal ureter/UVJ. 2. 1 mm nonobstructive calculus in the right kidney lower pole. 3.  Prominent stool throughout the colon favors constipation. 4. Stable 3.3 cm densely calcified left fundal intramural fibroid of the uterus. 5. Stable 9 mm probable cyst in segment 4b of the liver.   Electronically Signed   By: Thayer Jew  Janeece Fitting M.D.   On: 09/25/2014 17:52     EKG Interpretation None     16 10 PM patient requesting additional pain medicine and antiemetics. Intravenous hydromorphone and Zofran ordered. At 1650 p.m. she feels ready to go home. Pain and nausea are under control. She is able to drink without nausea or vomiting. Results for orders placed or performed during the hospital encounter of 09/26/14  I-Stat Chem 8, ED  Result Value Ref Range   Sodium 140 135 - 145 mmol/L   Potassium 4.6 3.5 - 5.1 mmol/L   Chloride 105 96 - 112 mmol/L   BUN 7 6 - 23 mg/dL   Creatinine, Ser 0.60 0.50 - 1.10 mg/dL   Glucose, Bld 99 70 - 99 mg/dL   Calcium,  Ion 1.18 1.12 - 1.23 mmol/L   TCO2 21 0 - 100 mmol/L   Hemoglobin 14.6 12.0 - 15.0 g/dL   HCT 43.0 36.0 - 46.0 %   Ct Abdomen Pelvis Wo Contrast  09/25/2014   CLINICAL DATA:  Left flank pain. History of renal calculi. History of renal calculi at of  EXAM: CT ABDOMEN AND PELVIS WITHOUT CONTRAST  TECHNIQUE: Multidetector CT imaging of the abdomen and pelvis was performed following the standard protocol without IV contrast.  COMPARISON:  Multiple exams, including 05/19/2014 and 06/02/2012  FINDINGS: Lower chest: Linear subsegmental atelectasis in the posterior basal segment left lower lobe.  Hepatobiliary: Hypodense 0.9 by 0.7 cm lesion in segment 4b of the liver, image 17 series 2, no change from 06/02/2012.  Pancreas: Unremarkable  Spleen: Unremarkable  Adrenals/Urinary Tract: Increased fullness of the left collecting system without overt hydronephrosis; mild left hydroureter extending down to the distal ureter. There is a new 3 mm calculus in the vicinity of the left UVJ and also a very faint punctate calculus near the left distal ureter measuring approximately 1 mm in diameter, on images 71 and 68 of series 2, respectively. Both of these are difficult to separate from the distal ureter, and given the hydroureter I am suspicious that 1 or both represent partially obstructive calculi.  Possible 1 mm right kidney lower pole nonobstructive calculus, image 55 series 3.  Stomach/Bowel: Prominent stool throughout the colon favors constipation.  Vascular/Lymphatic: Unremarkable  Reproductive: 3.3 by 2.7 cm densely calcified left fundal intramural fibroid.  Other: No supplemental non-categorized findings.  Musculoskeletal: Unremarkable  IMPRESSION: 1. New mild left hydroureter and slight fullness of left collecting system without overt hydronephrosis. Two new calcifications in the vicinity of the left distal ureter in difficult to separate from the distal ureter. These could conceivably be phleboliths, particularly  given lack of a definitive tissue rim sign, but I am suspicious that one or both may be within the distal ureter/UVJ. 2. 1 mm nonobstructive calculus in the right kidney lower pole. 3.  Prominent stool throughout the colon favors constipation. 4. Stable 3.3 cm densely calcified left fundal intramural fibroid of the uterus. 5. Stable 9 mm probable cyst in segment 4b of the liver.   Electronically Signed   By: Van Clines M.D.   On: 09/25/2014 17:52    MDM  Plan prescription Reglan. She has follow-up arranged with [facility] for 09/28/2014 Diagnosis #1 ureteral colic left side #2 nausea and vomiting Final diagnoses:  None        Orlie Dakin, MD 09/26/14 1657

## 2014-09-26 NOTE — Discharge Instructions (Signed)
Kidney Stones You can try the nausea medication prescribed today for nausea and vomiting together with the Zofran prescribed yesterday or separately. Take your dilaudid prescribed yesterday for severe pain or Tylenol for mild pain. Keep your scheduled appointment with [facility] in 2 days. Return if pain or nausea are not well-controlled with the medications prescribed Kidney stones (urolithiasis) are solid masses that form inside your kidneys. The intense pain is caused by the stone moving through the kidney, ureter, bladder, and urethra (urinary tract). When the stone moves, the ureter starts to spasm around the stone. The stone is usually passed in your pee (urine).  HOME CARE  Drink enough fluids to keep your pee clear or pale yellow. This helps to get the stone out.  Strain all pee through the provided strainer. Do not pee without peeing through the strainer, not even once. If you pee the stone out, catch it in the strainer. The stone may be as small as a grain of salt. Take this to your doctor. This will help your doctor figure out what you can do to try to prevent more kidney stones.  Only take medicine as told by your doctor.  Follow up with your doctor as told.  Get follow-up X-rays as told by your doctor. GET HELP IF: You have pain that gets worse even if you have been taking pain medicine. GET HELP RIGHT AWAY IF:   Your pain does not get better with medicine.  You have a fever or shaking chills.  Your pain increases and gets worse over 18 hours.  You have new belly (abdominal) pain.  You feel faint or pass out.  You are unable to pee. MAKE SURE YOU:   Understand these instructions.  Will watch your condition.  Will get help right away if you are not doing well or get worse. Document Released: 01/03/2008 Document Revised: 03/19/2013 Document Reviewed: 12/18/2012 [facility] Patient Information 2015 [place], [place]. This information is not intended to replace advice  given to you by your health care provider. Make sure you discuss any questions you have with your health care provider.

## 2014-09-26 NOTE — ED Notes (Signed)
Pt states that she has kidney stone.  Was here last night.  Has continued vomiting and cannot keep pain meds down.

## 2014-09-29 ENCOUNTER — Other Ambulatory Visit: Payer: Self-pay | Admitting: Urology

## 2014-09-30 NOTE — Patient Instructions (Addendum)
Beth Spence  09/30/2014   Your procedure is scheduled on: 10/02/2014    Report to [facility] Main  Entrance and follow signs to               [place] at       [place].  Call this number if you have problems the morning of surgery [phone]   Remember:  Do not eat food or drink liquids :After Midnight.     Take these medicines the morning of surgery with A SIP OF WATER: NONE                               You may not have any metal on your body including hair pins and              piercings  Do not wear jewelry, make-up, lotions, powders, perfumes or deodorant.               Do not wear nail polish.  Do not shave  48 hours prior to surgery.                 Do not bring valuables to the hospital. [place].  Contacts, dentures or bridgework may not be worn into surgery.       Patients discharged the day of surgery will not be allowed to drive home.  Name and phone number of your driver:David Blanda (husband)   Special Instructions:coughing and deep breathing exercises, leg exercises               _____________________________________________________________________             [facility] - Preparing for Surgery Before surgery, you can play an important role.  Because skin is not sterile, your skin needs to be as free of germs as possible.  You can reduce the number of germs on your skin by washing with CHG (chlorahexidine gluconate) soap before surgery.  CHG is an antiseptic cleaner which kills germs and bonds with the skin to continue killing germs even after washing. Please DO NOT use if you have an allergy to CHG or antibacterial soaps.  If your skin becomes reddened/irritated stop using the CHG and inform your nurse when you arrive at Short Stay. Do not shave (including legs and underarms) for at least 48 hours prior to the first CHG shower.  You may shave your face/neck. Please follow these  instructions carefully:  1.  Shower with CHG Soap the night before surgery and the  morning of Surgery.  2.  If you choose to wash your hair, wash your hair first as usual with your  normal  shampoo.  3.  After you shampoo, rinse your hair and body thoroughly to remove the  shampoo.                           4.  Use CHG as you would any other liquid soap.  You can apply chg directly  to the skin and wash                       Gently with a scrungie or clean washcloth.  5.  Apply  the CHG Soap to your body ONLY FROM THE NECK DOWN.   Do not use on face/ open                           Wound or open sores. Avoid contact with eyes, ears mouth and genitals (private parts).                       Wash face,  Genitals (private parts) with your normal soap.             6.  Wash thoroughly, paying special attention to the area where your surgery  will be performed.  7.  Thoroughly rinse your body with warm water from the neck down.  8.  DO NOT shower/wash with your normal soap after using and rinsing off  the CHG Soap.                9.  Pat yourself dry with a clean towel.            10.  Wear clean pajamas.            11.  Place clean sheets on your bed the night of your first shower and do not  sleep with pets. Day of Surgery : Do not apply any lotions/deodorants the morning of surgery.  Please wear clean clothes to the hospital/surgery center.  FAILURE TO FOLLOW THESE INSTRUCTIONS MAY RESULT IN THE CANCELLATION OF YOUR SURGERY PATIENT SIGNATURE_________________________________  NURSE SIGNATURE__________________________________  ________________________________________________________________________

## 2014-10-01 ENCOUNTER — Encounter (HOSPITAL_COMMUNITY)
Admission: RE | Admit: 2014-10-01 | Discharge: 2014-10-01 | Disposition: A | Discharge status: Home / Self Care | Payer: BLUE CROSS/BLUE SHIELD | Source: Ambulatory Visit | Attending: Urology | Admitting: Urology

## 2014-10-01 ENCOUNTER — Encounter (HOSPITAL_COMMUNITY): Payer: Self-pay

## 2014-10-01 DIAGNOSIS — R34 Anuria and oliguria: Secondary | ICD-10-CM | POA: Diagnosis not present

## 2014-10-01 DIAGNOSIS — Z880 Allergy status to penicillin: Secondary | ICD-10-CM | POA: Diagnosis not present

## 2014-10-01 DIAGNOSIS — F419 Anxiety disorder, unspecified: Secondary | ICD-10-CM | POA: Diagnosis not present

## 2014-10-01 DIAGNOSIS — Z882 Allergy status to sulfonamides status: Secondary | ICD-10-CM | POA: Diagnosis not present

## 2014-10-01 DIAGNOSIS — R109 Unspecified abdominal pain: Secondary | ICD-10-CM | POA: Diagnosis present

## 2014-10-01 DIAGNOSIS — Z881 Allergy status to other antibiotic agents status: Secondary | ICD-10-CM | POA: Diagnosis not present

## 2014-10-01 DIAGNOSIS — Z8701 Personal history of pneumonia (recurrent): Secondary | ICD-10-CM | POA: Diagnosis not present

## 2014-10-01 DIAGNOSIS — Z87442 Personal history of urinary calculi: Secondary | ICD-10-CM | POA: Diagnosis not present

## 2014-10-01 DIAGNOSIS — F329 Major depressive disorder, single episode, unspecified: Secondary | ICD-10-CM | POA: Diagnosis not present

## 2014-10-01 HISTORY — DX: Cardiac murmur, unspecified: R01.1

## 2014-10-01 HISTORY — DX: Benign neoplasm of connective and other soft tissue, unspecified: D21.9

## 2014-10-01 HISTORY — DX: Anxiety disorder, unspecified: F41.9

## 2014-10-01 HISTORY — DX: Bronchitis, not specified as acute or chronic: J40

## 2014-10-01 HISTORY — DX: Pneumonia, unspecified organism: J18.9

## 2014-10-01 LAB — PREGNANCY, URINE: PREG TEST UR: NEGATIVE

## 2014-10-01 MED ORDER — GENTAMICIN SULFATE 40 MG/ML IJ SOLN
5.0000 mg/kg | INTRAVENOUS | Status: AC
  Administered 2014-10-02: 280 mg via INTRAVENOUS
  Filled 2014-10-01 (×2): qty 7

## 2014-10-01 NOTE — Progress Notes (Addendum)
Istat Chem 8 09/26/2014 epic  Istat HCG blood 09/25/2014 epic CBCD 09/25/2014 epic  CMP epic 09/25/2014  Lipase epic 09/25/2014 Urinalysis 09/25/2014 epic  CT abd and pelvis 09/25/2014 epic lungs mentioned

## 2014-10-02 ENCOUNTER — Ambulatory Visit (HOSPITAL_COMMUNITY): Payer: BLUE CROSS/BLUE SHIELD | Admitting: Anesthesiology

## 2014-10-02 ENCOUNTER — Encounter (HOSPITAL_COMMUNITY): Payer: Self-pay | Admitting: Anesthesiology

## 2014-10-02 ENCOUNTER — Ambulatory Visit (HOSPITAL_COMMUNITY)
Admission: RE | Admit: 2014-10-02 | Discharge: 2014-10-02 | Disposition: A | Discharge status: Home / Self Care | Payer: BLUE CROSS/BLUE SHIELD | Source: Ambulatory Visit | Attending: Urology | Admitting: Urology

## 2014-10-02 ENCOUNTER — Encounter (HOSPITAL_COMMUNITY)
Admission: RE | Disposition: A | Discharge status: Home / Self Care | Payer: Self-pay | Source: Ambulatory Visit | Attending: Urology

## 2014-10-02 DIAGNOSIS — F419 Anxiety disorder, unspecified: Secondary | ICD-10-CM | POA: Insufficient documentation

## 2014-10-02 DIAGNOSIS — Z87442 Personal history of urinary calculi: Secondary | ICD-10-CM | POA: Insufficient documentation

## 2014-10-02 DIAGNOSIS — F329 Major depressive disorder, single episode, unspecified: Secondary | ICD-10-CM | POA: Insufficient documentation

## 2014-10-02 DIAGNOSIS — R34 Anuria and oliguria: Secondary | ICD-10-CM | POA: Insufficient documentation

## 2014-10-02 DIAGNOSIS — Z880 Allergy status to penicillin: Secondary | ICD-10-CM | POA: Insufficient documentation

## 2014-10-02 DIAGNOSIS — Z8701 Personal history of pneumonia (recurrent): Secondary | ICD-10-CM | POA: Insufficient documentation

## 2014-10-02 DIAGNOSIS — Z882 Allergy status to sulfonamides status: Secondary | ICD-10-CM | POA: Insufficient documentation

## 2014-10-02 DIAGNOSIS — Z881 Allergy status to other antibiotic agents status: Secondary | ICD-10-CM | POA: Insufficient documentation

## 2014-10-02 HISTORY — PX: CYSTOSCOPY WITH RETROGRADE PYELOGRAM, URETEROSCOPY AND STENT PLACEMENT: SHX5789

## 2014-10-02 SURGERY — CYSTOURETEROSCOPY, WITH RETROGRADE PYELOGRAM AND STENT INSERTION
Anesthesia: General | Laterality: Left

## 2014-10-02 MED ORDER — FENTANYL CITRATE 0.05 MG/ML IJ SOLN: 25.0000 ug | INTRAMUSCULAR | Status: DC | PRN

## 2014-10-02 MED ORDER — FENTANYL CITRATE 0.05 MG/ML IJ SOLN
INTRAMUSCULAR | Status: DC | PRN
Start: 2014-10-02 — End: 2014-10-02
  Administered 2014-10-02: 50 ug via INTRAVENOUS

## 2014-10-02 MED ORDER — NITROFURANTOIN MONOHYD MACRO 100 MG PO CAPS: 100.0000 mg | ORAL_CAPSULE | Freq: Two times a day (BID) | ORAL | Status: DC

## 2014-10-02 MED ORDER — IOHEXOL 300 MG/ML  SOLN
INTRAMUSCULAR | Status: DC | PRN
  Administered 2014-10-02: 25 mL

## 2014-10-02 MED ORDER — PROPOFOL 10 MG/ML IV BOLUS
INTRAVENOUS | Status: DC | PRN
  Administered 2014-10-02: 200 mg via INTRAVENOUS

## 2014-10-02 MED ORDER — MIDAZOLAM HCL 2 MG/2ML IJ SOLN
INTRAMUSCULAR | Status: AC
  Filled 2014-10-02: qty 2

## 2014-10-02 MED ORDER — LACTATED RINGERS IV SOLN
INTRAVENOUS | Status: DC | PRN
  Administered 2014-10-02 (×2): via INTRAVENOUS

## 2014-10-02 MED ORDER — DEXAMETHASONE SODIUM PHOSPHATE 10 MG/ML IJ SOLN
INTRAMUSCULAR | Status: DC | PRN
  Administered 2014-10-02: 10 mg via INTRAVENOUS

## 2014-10-02 MED ORDER — PROMETHAZINE HCL 25 MG/ML IJ SOLN
6.2500 mg | INTRAMUSCULAR | Status: DC | PRN
  Administered 2014-10-02: 6.25 mg via INTRAVENOUS
  Filled 2014-10-02: qty 1

## 2014-10-02 MED ORDER — FENTANYL CITRATE 0.05 MG/ML IJ SOLN
INTRAMUSCULAR | Status: AC
  Filled 2014-10-02: qty 2

## 2014-10-02 MED ORDER — BELLADONNA ALKALOIDS-OPIUM 16.2-60 MG RE SUPP
RECTAL | Status: AC
  Filled 2014-10-02: qty 1

## 2014-10-02 MED ORDER — LIDOCAINE HCL (CARDIAC) 20 MG/ML IV SOLN
INTRAVENOUS | Status: DC | PRN
  Administered 2014-10-02: 100 mg via INTRAVENOUS

## 2014-10-02 MED ORDER — HYDROMORPHONE HCL 1 MG/ML IJ SOLN
2.0000 mg | Freq: Once | INTRAMUSCULAR | Status: AC
Start: 2014-10-02 — End: 2014-10-02
  Administered 2014-10-02 (×2): 1 mg via INTRAVENOUS
  Filled 2014-10-02: qty 2

## 2014-10-02 MED ORDER — ONDANSETRON HCL 4 MG/2ML IJ SOLN
INTRAMUSCULAR | Status: DC | PRN
  Administered 2014-10-02: 4 mg via INTRAVENOUS

## 2014-10-02 MED ORDER — KETOROLAC TROMETHAMINE 30 MG/ML IJ SOLN
30.0000 mg | Freq: Once | INTRAMUSCULAR | Status: AC
  Administered 2014-10-02: 30 mg via INTRAVENOUS
  Filled 2014-10-02: qty 1

## 2014-10-02 MED ORDER — SCOPOLAMINE 1 MG/3DAYS TD PT72
MEDICATED_PATCH | TRANSDERMAL | Status: AC
  Filled 2014-10-02: qty 1

## 2014-10-02 MED ORDER — HYDROMORPHONE HCL 2 MG PO TABS: 2.0000 mg | ORAL_TABLET | Freq: Four times a day (QID) | ORAL | Status: DC | PRN

## 2014-10-02 MED ORDER — PROPOFOL 10 MG/ML IV BOLUS
INTRAVENOUS | Status: AC
  Filled 2014-10-02: qty 20

## 2014-10-02 MED ORDER — SODIUM CHLORIDE 0.9 % IR SOLN
Status: DC | PRN
  Administered 2014-10-02: 4000 mL

## 2014-10-02 MED ORDER — SCOPOLAMINE 1 MG/3DAYS TD PT72
MEDICATED_PATCH | TRANSDERMAL | Status: DC | PRN
  Administered 2014-10-02: 1 via TRANSDERMAL

## 2014-10-02 MED ORDER — LIDOCAINE HCL 2 % EX GEL
CUTANEOUS | Status: AC
  Filled 2014-10-02: qty 10

## 2014-10-02 MED ORDER — 0.9 % SODIUM CHLORIDE (POUR BTL) OPTIME
TOPICAL | Status: DC | PRN
  Administered 2014-10-02: 1000 mL

## 2014-10-02 MED ORDER — HYDROMORPHONE HCL 2 MG PO TABS
2.0000 mg | ORAL_TABLET | Freq: Once | ORAL | Status: AC
  Administered 2014-10-02: 2 mg via ORAL
  Filled 2014-10-02 (×2): qty 1

## 2014-10-02 MED ORDER — MIDAZOLAM HCL 5 MG/5ML IJ SOLN
INTRAMUSCULAR | Status: DC | PRN
  Administered 2014-10-02: 2 mg via INTRAVENOUS

## 2014-10-02 MED ORDER — SENNOSIDES-DOCUSATE SODIUM 8.6-50 MG PO TABS: 1.0000 | ORAL_TABLET | Freq: Two times a day (BID) | ORAL | Status: DC

## 2014-10-02 MED ORDER — LIDOCAINE HCL (CARDIAC) 20 MG/ML IV SOLN
INTRAVENOUS | Status: AC
  Filled 2014-10-02: qty 5

## 2014-10-02 SURGICAL SUPPLY — 25 items
BAG URINE DRAINAGE (UROLOGICAL SUPPLIES) IMPLANT
BASKET LASER NITINOL 1.9FR (BASKET) IMPLANT
BASKET STNLS GEMINI 4WIRE 3FR (BASKET) IMPLANT
BASKET ZERO TIP NITINOL 2.4FR (BASKET) IMPLANT
CATH INTERMIT  6FR 70CM (CATHETERS) ×4 IMPLANT
CLOTH BEACON ORANGE TIMEOUT ST (SAFETY) ×4 IMPLANT
ELECT REM PT RETURN 9FT ADLT (ELECTROSURGICAL)
ELECTRODE REM PT RTRN 9FT ADLT (ELECTROSURGICAL) IMPLANT
FIBER LASER FLEXIVA 1000 (UROLOGICAL SUPPLIES) IMPLANT
FIBER LASER FLEXIVA 200 (UROLOGICAL SUPPLIES) IMPLANT
FIBER LASER FLEXIVA 365 (UROLOGICAL SUPPLIES) IMPLANT
FIBER LASER FLEXIVA 550 (UROLOGICAL SUPPLIES) IMPLANT
FIBER LASER TRAC TIP (UROLOGICAL SUPPLIES) IMPLANT
GLOVE BIOGEL M STRL SZ7.5 (GLOVE) ×4 IMPLANT
GOWN STRL REUS W/TWL LRG LVL3 (GOWN DISPOSABLE) ×8 IMPLANT
GUIDEWIRE ANG ZIPWIRE 038X150 (WIRE) ×8 IMPLANT
GUIDEWIRE STR DUAL SENSOR (WIRE) ×4 IMPLANT
IV NS IRRIG 3000ML ARTHROMATIC (IV SOLUTION) ×4 IMPLANT
PACK CYSTO (CUSTOM PROCEDURE TRAY) ×4 IMPLANT
SHEATH ACCESS URETERAL 24CM (SHEATH) ×4 IMPLANT
SHIELD EYE BINOCULAR (MISCELLANEOUS) IMPLANT
STENT POLARIS 5FRX22 (STENTS) ×8 IMPLANT
SYRINGE 10CC LL (SYRINGE) IMPLANT
SYRINGE IRR TOOMEY STRL 70CC (SYRINGE) IMPLANT
TUBE FEEDING 8FR 16IN STR KANG (MISCELLANEOUS) ×4 IMPLANT

## 2014-10-02 NOTE — Progress Notes (Signed)
Strings x 2 taped to inner thigh intact.

## 2014-10-02 NOTE — Op Note (Signed)
NAME:  Beth Spence, Beth Spence NO.:  [id number]  MEDICAL RECORD NO.:  [id number]  LOCATION:                               FACILITY:  [facility]  PHYSICIAN:  Alexis Frock, MD     DATE OF BIRTH:  1975/10/15  DATE OF PROCEDURE: 10/02/2014 DATE OF DISCHARGE:  10/02/2014                              OPERATIVE REPORT   DIAGNOSES:  Left greater than right flank pain, left ureteral stone by CT.  PROCEDURES: 1. Cystoscopy with bilateral retrograde pyelogram interpretation. 2. Bilateral diagnostic ureteroscopy. 3. Insertion of bilateral ureteral stents, 5 x 24 Polaris with tether.  ESTIMATED BLOOD LOSS:  Nil.  COMPLICATIONS:  None.  ASSISTANT:  None.  FINDINGS: 1. Unremarkable bilateral retrograde pyelograms.  No     hydroureteronephrosis or filling defects. 2. Unremarkable bilateral ureteroscopy.  No evidence of urolithiasis     whatsoever in the ureters or kidney with inspection x3 each side. 3. Successful placement of bilateral ureteral stents, proximal in     renal pelvis and distal in urinary bladder x2.  INDICATION:  Beth Spence is a very pleasant 39 year old lady with history of recurrent nephrolithiasis.  She has had a prodrome of colicky left-sided pelvic and flank pain for several weeks.  She had ER imaging that revealed likely left distal ureteral stone that was quite small with mild hydroureteronephrosis as well as some punctate calcifications in bilateral kidneys.  Options were discussed for management including continued medical expulsive therapy versus shockwave lithotripsy as her distal stone was visible on KUB versus ureteroscopic stone manipulation, unilateral versus bilateral with goal of  free.  She adamantly wants to proceed with the latter.  No interval stone passage was reported and she wished to proceed.  Informed consent was obtained and placed in medical record.  PROCEDURE IN DETAIL:  The patient being Beth Spence was verified. Procedure  being cystoscopy, bilateral retrograde pyelogram,  bilateral ureteroscopic stone manipulation was confirmed.  Procedure was carried out.  Time-out was performed.  Intravenous antibiotics administered.  General LMA anesthesia was introduced.  The patient placed into a low lithotomy position and sterile field was created by prepping and draping the patient's vagina, introitus, and proximal thighs using iodine x3.  Next, cystourethroscopy was performed using a 22-French rigid cystoscope with 30-degree offset lens.  Inspection of the urinary bladder revealed no diverticula, calcifications, papular lesions.  The right ureteral orifice was cannulated with a 6-French end- hole catheter and right retrograde pyelogram was obtained.  Right retrograde pyelogram demonstrated a single right ureter, single system right kidney.  No filling defects or narrowing noted.  A 0.038 ZIPwire was advanced at the level of the upper pole and set aside as a safety wire.  Next, left retrograde pyelogram was obtained.  Left retrograde pyelogram demonstrated a single left ureter, single system left kidney.  There was a questionable filling defect in the left distal to mid ureter consistent with possible stone versus air bubble. There was no significant hydroureteronephrosis.  A separate 0.038 ZIPwire was advanced at the level of the upper pole and set aside as a safety wire.  Feeding tube was placed in the urinary bladder for pressure release.  Next, semi-rigid ureteroscopy  was performed in the distal left ureter, allowing inspection of the entire length of left ureter alongside a separate Sensor working wire.  No mucosal abnormalities or stones were seen whatsoever.  The semi-rigid scope was exchanged for a 12/14, 24 cm ureteral access sheath at the level of proximal ureter using fluoroscopic guidance.  Next, flexible digital ureteroscopy was confirmed in the left kidney using a single channel digital  ureteroscope and inspection of each calix x3.  There was no evidence of urolithiasis whatsoever.  The system did appear decompressed as well.  Based on retrograde pyelogram and internal anatomy, there was very, very low suspicion for any sort of duplication.  It was felt that this likely represented either interval stone passage versus phlebolith or other calcification explaining her radiographic abnormality.  She was noted on retrograde pyelography to have what appeared to be a fibroid overlying the course of the inner left hemipelvis.  As such the sheath was removed under continuous ureteroscopic vision.  No mucosal abnormalities were found.  Attention was then directed at the right side as the left side examination was unremarkable, and newly this morning, the patient was also complaining of some right flank pain and clearly had evidence of right-sided urolithiasis as well.  It was truly felt that endoscopic examination of the right side was warranted.  As such, a separate Sensor working wire was advanced up the right ureter to the level of the renal pelvis, over which the single channel of digital ureteroscope was carefully navigated to the upper pole.  Flexible digital ureteroscopy was performed of the right kidney allowing and panendoscopic examination of each calix and collecting system x3.  Again, no mucosal abnormalities or calcifications were noted whatsoever.  The scope was removed under continuous ureteroscopic vision.  No mucosal abnormalities or calcifications were seen in the entire length of the right ureter. Again, based on retrograde pyelography, there was minimal concern for duplication.  It was felt that the patient was clinically stone-free. Given her bilateral ureteroscopy, it was felt that bilateral ureteral stenting was pretty warranted to help prevent bilateral ureteral edema. As such, a new 5 x 22 Polaris stent was placed over the remaining left safety wire.   Good proximal and distal deployment was noted.  This was performed using fluoroscopic guidance.  Similarly, a right 5 x 22 Polaris stent was placed over the remaining right safety wire.  Good proximal and distal deployment was noted radiographically.  Bladder was emptied per cystoscope.  Tether to each stent was fashioned together and taped to the thigh and procedure was terminated.  The patient tolerated the procedure well.  There were no immediate periprocedural complications.  The patient was taken to postanesthesia care unit in stable condition.          ______________________________ Alexis Frock, MD     TM/MEDQ  D:  10/02/2014  T:  10/02/2014  Job:  [id number]

## 2014-10-02 NOTE — Brief Op Note (Signed)
10/02/2014  8:03 AM  PATIENT:  Beth Spence  39 y.o. female  PRE-OPERATIVE DIAGNOSIS:  LEFT URETERAL STONE, Bilateral Flank Pain, Bilateral renal stones  POST-OPERATIVE DIAGNOSIS:  LEFT URETERAL STONE  PROCEDURE:  Procedure(s): CYSTOSCOPY, BILATERAL RETROGRADE PYELOGRAM, LEFT URETEROSCOPY AND LEFT STENT PLACEMENT (Bilateral) HOLMIUM LASER APPLICATION (Left)  SURGEON:  Surgeon(s) and Role:    * Alexis Frock, MD - Primary  PHYSICIAN ASSISTANT:   ASSISTANTS: none   ANESTHESIA:   general  EBL:     BLOOD ADMINISTERED:none  DRAINS: none   LOCAL MEDICATIONS USED:  NONE  SPECIMEN:  No Specimen  DISPOSITION OF SPECIMEN:  N/A  COUNTS:  YES  TOURNIQUET:  * No tourniquets in log *  DICTATION: .Other Dictation: Dictation Number [id number]  PLAN OF CARE: Discharge to home after PACU  PATIENT DISPOSITION:  PACU - hemodynamically stable.   Delay start of Pharmacological VTE agent (>24hrs) due to surgical blood loss or risk of bleeding: not applicable

## 2014-10-02 NOTE — Anesthesia Postprocedure Evaluation (Signed)
  Anesthesia Post-op Note  Patient: Beth Spence  Procedure(s) Performed: Procedure(s) (LRB): CYSTOSCOPY, BILATERAL RETROGRADE PYELOGRAM, BILATERAL DIAGNOSTIC URETEROSCOPY AND BILATERAL STENT PLACEMENT (Bilateral)  Patient Location: PACU  Anesthesia Type: General  Level of Consciousness: awake and alert   Airway and Oxygen Therapy: Patient Spontanous Breathing  Post-op Pain: mild  Post-op Assessment: Post-op Vital signs reviewed, Patient's Cardiovascular Status Stable, Respiratory Function Stable, Patent Airway and No signs of Nausea or vomiting  Last Vitals:  Filed Vitals:   10/02/14 1400  BP: 112/70  Pulse: 65  Temp: 36.8 C  Resp: 16    Post-op Vital Signs: stable   Complications: No apparent anesthesia complications

## 2014-10-02 NOTE — Transfer of Care (Signed)
Immediate Anesthesia Transfer of Care Note  Patient: Beth Spence  Procedure(s) Performed: Procedure(s): CYSTOSCOPY, BILATERAL RETROGRADE PYELOGRAM, BILATERAL DIAGNOSTIC URETEROSCOPY AND BILATERAL STENT PLACEMENT (Bilateral)  Patient Location: PACU  Anesthesia Type:General  Level of Consciousness: sedated  Airway & Oxygen Therapy: Patient Spontanous Breathing and Patient connected to face mask oxygen  Post-op Assessment: Report given to RN and Post -op Vital signs reviewed and stable  Post vital signs: Reviewed and stable  Last Vitals:  Filed Vitals:   10/02/14 0546  BP: 114/72  Pulse: 76  Temp: 36.4 C  Resp: 16    Complications: No apparent anesthesia complications

## 2014-10-02 NOTE — Discharge Instructions (Signed)
1 - You may have urinary urgency (bladder spasms) and bloody urine on / off with stent in place. This is normal.  2 - Call MD or go to ER for fever >102, severe pain / nausea / vomiting not relieved by medications, or acute change in medical status  3- Remove bilateral tethered stents on Monday morning at home by pulling on strings, then blue-white plastic tubing and discarding. There are two stents. Dr. Tresa Moore is in the office Monday morning if any issues arise.   Ureteral Stent Implantation, Care After Refer to this sheet in the next few weeks. These instructions provide you with information on caring for yourself after your procedure. Your health care provider may also give you more specific instructions. Your treatment has been planned according to current medical practices, but problems sometimes occur. Call your health care provider if you have any problems or questions after your procedure. WHAT TO EXPECT AFTER THE PROCEDURE You should be back to normal activity within 48 hours after the procedure. Nausea and vomiting may occur and are commonly the result of anesthesia. It is common to experience sharp pain in the back or lower abdomen and penis with voiding. This is caused by movement of the ends of the stent with the act of urinating.It usually goes away within minutes after you have stopped urinating. HOME CARE INSTRUCTIONS Make sure to drink plenty of fluids. You may have small amounts of bleeding, causing your urine to be red. This is normal. Certain movements may trigger pain or a feeling that you need to urinate. You may be given medicines to prevent infection or bladder spasms. Be sure to take all medicines as directed. Only take over-the-counter or prescription medicines for pain, discomfort, or fever as directed by your health care provider. Do not take aspirin, as this can make bleeding worse. Your stent will be left in until the blockage is resolved. This may take 2 weeks or longer,  depending on the reason for stent implantation. You may have an X-ray exam to make sure your ureter is open and that the stent has not moved out of position (migrated). The stent can be removed by your health care provider in the office. Medicines may be given for comfort while the stent is being removed. Be sure to keep all follow-up appointments so your health care provider can check that you are healing properly. SEEK MEDICAL CARE IF:  You experience increasing pain.  Your pain medicine is not working. SEEK IMMEDIATE MEDICAL CARE IF:  Your urine is dark red or has blood clots.  You are leaking urine (incontinent).  You have a fever, chills, feeling sick to your stomach (nausea), or vomiting.  Your pain is not relieved by pain medicine.  The end of the stent comes out of the urethra.  You are unable to urinate. Document Released: 03/19/2013 Document Revised: 07/22/2013 Document Reviewed: 03/19/2013 [facility] Patient Information 2015 [place], [place]. This information is not intended to replace advice given to you by your health care provider. Make sure you discuss any questions you have with your health care provider.

## 2014-10-02 NOTE — H&P (Signed)
Beth Spence is an 39 y.o. female.    Chief Complaint: Pre-OP Left Ureteroscopic Stone Manipulation  HPI:   1 - Recurrent Nephrolithiasis -  Pre 2013 - SWL x several, URS x several 05/2012 - Rt URS 2014 - KUB stone free 04/2014 - KUB stone free 08/2014 - ER CT 55mm left distal ureteral stone wit mild hydro (just above pelvic brim at upper level of upper 1/3 femoral head), several punctate left renal, single Rt punctate renal on eval left flank pain + nausea  2 - Metabolic Stone Disease / Oliguria / Hypercalciuria -  Eval 2013: BMP,Urate - normal; Composition - 100%CaOx; 24 Hr Urines - low volume (1L), mil high calcium (276)  PMH sig for cesarean x 2, jaw surgery. No CV disease. No strong blood thinners.   Today Beth Spence is seen to proceed with left ureteroscopic stone manipulation for refractory colic related to her small distal left ureteral stone. NO interval fevers.  Past Medical History  Diagnosis Date  . Depression   . Right ureteral stone   . History of kidney stones   . Right flank pain   . Frequency of urination   . Urgency of urination   . PONV (postoperative nausea and vomiting)   . Heart murmur   . Bronchitis     age 29  . Pneumonia     viral and bacterial at age 47  . Anxiety   . Fibroids     Past Surgical History  Procedure Laterality Date  . Ureteroscopic stone extractions  2003;  2001  X3;  1999  X1  . Extracorporeal shock wave lithotripsy  2012  . Dilatation and curretage with suction  2008    MISSED AB  . Cesarean section  2006  &  2009  . Orif bilateral jaw fx's  1991  . Cystoscopy with retrograde pyelogram, ureteroscopy and stent placement  06/02/2012    Procedure: CYSTOSCOPY WITH RETROGRADE PYELOGRAM, URETEROSCOPY AND STENT PLACEMENT;  Surgeon: Molli Hazard, MD;  Location: [facility]S;  Service: Urology;  Laterality: Right;    History reviewed. No pertinent family history. Social History:  reports that she has never smoked. She has never used  smokeless tobacco. She reports that she does not drink alcohol or use illicit drugs.  Allergies:  Allergies  Allergen Reactions  . Amoxil [Amoxicillin] Hives  . Cephalexin Hives and Other (See Comments)    MOUTH BLISTERS  . Darvocet [Propoxyphene N-Acetaminophen] Hives  . Penicillins Hives  . Percocet [Oxycodone-Acetaminophen] Hives  . Septra [Sulfamethoxazole-Trimethoprim] Hives and Other (See Comments)    MOUTH BLISTERS  . Sulfa Antibiotics Hives and Other (See Comments)    Mouth blisters    Medications Prior to Admission  Medication Sig Dispense Refill  . HYDROmorphone (DILAUDID) 2 MG tablet Take 2 tablets (4 mg total) by mouth every 4 (four) hours as needed for severe pain. 30 tablet 0  . lamoTRIgine (LAMICTAL) 200 MG tablet Take 200 mg by mouth at bedtime.     . metoCLOPramide (REGLAN) 10 MG tablet Take 1 tablet (10 mg total) by mouth every 6 (six) hours as needed for nausea or vomiting. 12 tablet 0  . OVER THE COUNTER MEDICATION Take 1 tablet by mouth daily.    . QUEtiapine (SEROQUEL) 100 MG tablet Take 100 mg by mouth at bedtime.      Results for orders placed or performed during the hospital encounter of 10/01/14 (from the past 48 hour(s))  Pregnancy, urine     Status:  None   Collection Time: 10/01/14  8:10 AM  Result Value Ref Range   Preg Test, Ur NEGATIVE NEGATIVE    Comment:        THE SENSITIVITY OF THIS METHODOLOGY IS >20 mIU/mL.    No results found.  Review of Systems  Constitutional: Negative.  Negative for fever and chills.  HENT: Negative.   Eyes: Negative.   Respiratory: Negative.   Cardiovascular: Negative.   Gastrointestinal: Negative.   Genitourinary: Positive for flank pain.  Musculoskeletal: Negative.   Skin: Negative.   Neurological: Negative.   Endo/Heme/Allergies: Negative.   Psychiatric/Behavioral: Negative.     Blood pressure 114/72, pulse 76, temperature 97.6 F (36.4 C), temperature source Oral, resp. rate 16, last menstrual period  08/31/2014, SpO2 100 %. Physical Exam  Constitutional: She appears well-developed.  HENT:  Head: Normocephalic.  Eyes: Pupils are equal, round, and reactive to light.  Neck: Normal range of motion.  Cardiovascular: Normal rate.   Respiratory: Effort normal.  GI: Soft.  Genitourinary:  Mild Lt CVAT  Musculoskeletal: Normal range of motion.  Neurological: She is alert.  Skin: Skin is warm.  Psychiatric: She has a normal mood and affect. Her behavior is normal. Judgment and thought content normal.     Assessment/Plan  1 - Nephrolithiasis - symptomatic very small volume left ureteral stone with only punctate renal stones, mostly ipsilateral.   We rediscussed ureteroscopic stone manipulation with basketing and laser-lithotripsy in detail.  We rediscussed risks including bleeding, infection, damage to kidney / ureter  bladder, rarely loss of kidney. We rediscussed anesthetic risks and rare but serious surgical complications including DVT, PE, MI, and mortality. We specificallyre addressed that in 5-10% of cases a staged approach is required with stenting followed by re-attempt ureteroscopy if anatomy unfavorable.   The patient voiced understanding and wises to proceed.   2 - Metabolic Stone Disease / Oliguria / Hypercalciuria - Reinforced need for excellent hydration. No now indapamide 2.5 QD.     Beth Spence 10/02/2014, 5:46 AM

## 2014-10-02 NOTE — Op Note (Deleted)
NAME:  Beth Spence, Beth Spence NO.:  [id number]  MEDICAL RECORD NO.:  [id number]  LOCATION:                               FACILITY:  [facility]  PHYSICIAN:  Alexis Frock, MD     DATE OF BIRTH:  1975-12-20  DATE OF PROCEDURE: 10/02/2014 DATE OF DISCHARGE:  10/02/2014                              OPERATIVE REPORT   DIAGNOSES:  Left greater than right flank pain, left ureteral stone by CT.  PROCEDURES: 1. Cystoscopy with bilateral retrograde pyelogram interpretation. 2. Bilateral diagnostic ureteroscopy. 3. Insertion of bilateral ureteral stents, 5 x 24 Polaris with tether.  ESTIMATED BLOOD LOSS:  Nil.  COMPLICATIONS:  None.  ASSISTANT:  None.  FINDINGS: 1. Unremarkable bilateral retrograde pyelograms.  No     hydroureteronephrosis or filling defects. 2. Unremarkable bilateral ureteroscopy.  No evidence of urolithiasis     whatsoever in the ureters or kidney with inspection x3 each side. 3. Successful placement of bilateral ureteral stents, proximal in     renal pelvis and distal in urinary bladder x2.  INDICATION:  Beth Spence is a very pleasant 39 year old lady with history of recurrent nephrolithiasis.  She has had a prodrome of colicky left-sided pelvic and flank pain for several weeks.  She had ER imaging that revealed likely left distal ureteral stone that was quite small with mild hydroureteronephrosis as well as some punctate calcifications in bilateral kidneys.  Options were discussed for management including continued medical expulsive therapy versus shockwave lithotripsy as her distal stone was visible on KUB versus ureteroscopic stone manipulation, unilateral versus bilateral with goal of  free.  She adamantly wants to proceed with the latter.  No interval stone passage was reported and she wished to proceed.  Informed consent was obtained and placed in medical record.  PROCEDURE IN DETAIL:  The patient being Beth Spence was verified. Procedure  being cystoscopy, bilateral retrograde pyelogram,  bilateral ureteroscopic stone manipulation was confirmed.  Procedure was carried out.  Time-out was performed.  Intravenous antibiotics administered.  General LMA anesthesia was introduced.  The patient placed into a low lithotomy position and sterile field was created by prepping and draping the patient's vagina, introitus, and proximal thighs using iodine x3.  Next, cystourethroscopy was performed using a 22-French rigid cystoscope with 30-degree offset lens.  Inspection of the urinary bladder revealed no diverticula, calcifications, papular lesions.  The right ureteral orifice was cannulated with a 6-French end- hole catheter and right retrograde pyelogram was obtained.  Right retrograde pyelogram demonstrated a single right ureter, single system right kidney.  No filling defects or narrowing noted.  A 0.038 ZIPwire was advanced at the level of the upper pole and set aside as a safety wire.  Next, left retrograde pyelogram was obtained.  Left retrograde pyelogram demonstrated a single left ureter, single system left kidney.  There was a questionable filling defect in the left distal to mid ureter consistent with possible stone versus air bubble. There was no significant hydroureteronephrosis.  A separate 0.038 ZIPwire was advanced at the level of the upper pole and set aside as a safety wire.  Feeding tube was placed in the urinary bladder for pressure release.  Next, semi-rigid ureteroscopy  was performed in the distal left ureter, allowing inspection of the entire length of left ureter alongside a separate Sensor working wire.  No mucosal abnormalities or stones were seen whatsoever.  The semi-rigid scope was exchanged for a 12/14, 24 cm ureteral access sheath at the level of proximal ureter using fluoroscopic guidance.  Next, flexible digital ureteroscopy was confirmed in the left kidney using a single channel digital  ureteroscope and inspection of each calix x3.  There was no evidence of urolithiasis whatsoever.  The system did appear decompressed as well.  Based on retrograde pyelogram and internal anatomy, there was very, very low suspicion for any sort of duplication.  It was felt that this likely represented either interval stone passage versus phlebolith or other calcification explaining her radiographic abnormality.  She was noted on retrograde pyelography to have what appeared to be a fibroid overlying the course of the inner left hemipelvis.  As such the sheath was removed under continuous ureteroscopic vision.  No mucosal abnormalities were found.  Attention was then directed at the right side as the left side examination was unremarkable, and newly this morning, the patient was also complaining of some right flank pain and clearly had evidence of right-sided urolithiasis as well.  It was truly felt that endoscopic examination of the right side was warranted.  As such, a separate Sensor working wire was advanced up the right ureter to the level of the renal pelvis, over which the single channel of digital ureteroscope was carefully navigated to the upper pole.  Flexible digital ureteroscopy was performed of the right kidney allowing and panendoscopic examination of each calix and collecting system x3.  Again, no mucosal abnormalities or calcifications were noted whatsoever.  The scope was removed under continuous ureteroscopic vision.  No mucosal abnormalities or calcifications were seen in the entire length of the right ureter. Again, based on retrograde pyelography, there was minimal concern for duplication.  It was felt that the patient was clinically stone-free. Given her bilateral ureteroscopy, it was felt that bilateral ureteral stenting was pretty warranted to help prevent bilateral ureteral edema. As such, a new 5 x 22 Polaris stent was placed over the remaining left safety wire.   Good proximal and distal deployment was noted.  This was performed using fluoroscopic guidance.  Similarly, a right 5 x 22 Polaris stent was placed over the remaining right safety wire.  Good proximal and distal deployment was noted radiographically.  Bladder was emptied per cystoscope.  Tether to each stent was fashioned together and taped to the thigh and procedure was terminated.  The patient tolerated the procedure well.  There were no immediate periprocedural complications.  The patient was taken to postanesthesia care unit in stable condition.          ______________________________ Alexis Frock, MD     TM/MEDQ  D:  10/02/2014  T:  10/02/2014  Job:  [id number]

## 2014-10-02 NOTE — Anesthesia Preprocedure Evaluation (Signed)
Anesthesia Evaluation  Patient identified by MRN, date of birth, ID band Patient awake    Reviewed: Allergy & Precautions, NPO status , Patient's Chart, lab work & pertinent test results  History of Anesthesia Complications (+) PONV and history of anesthetic complications  Airway Mallampati: II  TM Distance: >3 FB Neck ROM: Full    Dental no notable dental hx.    Pulmonary pneumonia -,  breath sounds clear to auscultation  Pulmonary exam normal       Cardiovascular negative cardio ROS  + Valvular Problems/Murmurs Rhythm:Regular Rate:Normal     Neuro/Psych PSYCHIATRIC DISORDERS Anxiety Depression negative neurological ROS     GI/Hepatic negative GI ROS, Neg liver ROS,   Endo/Other  negative endocrine ROS  Renal/GU negative Renal ROS  negative genitourinary   Musculoskeletal negative musculoskeletal ROS (+)   Abdominal   Peds negative pediatric ROS (+)  Hematology negative hematology ROS (+)   Anesthesia Other Findings   Reproductive/Obstetrics negative OB ROS                             Anesthesia Physical Anesthesia Plan  ASA: II  Anesthesia Plan: General   Post-op Pain Management:    Induction: Intravenous  Airway Management Planned: LMA  Additional Equipment:   Intra-op Plan:   Post-operative Plan: Extubation in OR  Informed Consent: I have reviewed the patients History and Physical, chart, labs and discussed the procedure including the risks, benefits and alternatives for the proposed anesthesia with the patient or authorized representative who has indicated his/her understanding and acceptance.   Dental advisory given  Plan Discussed with: CRNA  Anesthesia Plan Comments:         Anesthesia Quick Evaluation

## 2014-10-04 ENCOUNTER — Emergency Department (HOSPITAL_COMMUNITY)
Admission: EM | Admit: 2014-10-04 | Discharge: 2014-10-05 | Disposition: A | Discharge status: Home / Self Care | Payer: BLUE CROSS/BLUE SHIELD | Attending: Emergency Medicine | Admitting: Emergency Medicine

## 2014-10-04 ENCOUNTER — Encounter (HOSPITAL_COMMUNITY): Payer: Self-pay | Admitting: *Deleted

## 2014-10-04 ENCOUNTER — Emergency Department (HOSPITAL_COMMUNITY): Payer: BLUE CROSS/BLUE SHIELD

## 2014-10-04 DIAGNOSIS — R140 Abdominal distension (gaseous): Secondary | ICD-10-CM | POA: Insufficient documentation

## 2014-10-04 DIAGNOSIS — Z8701 Personal history of pneumonia (recurrent): Secondary | ICD-10-CM | POA: Diagnosis not present

## 2014-10-04 DIAGNOSIS — Z79899 Other long term (current) drug therapy: Secondary | ICD-10-CM | POA: Insufficient documentation

## 2014-10-04 DIAGNOSIS — R110 Nausea: Secondary | ICD-10-CM | POA: Diagnosis not present

## 2014-10-04 DIAGNOSIS — E876 Hypokalemia: Secondary | ICD-10-CM | POA: Diagnosis not present

## 2014-10-04 DIAGNOSIS — F329 Major depressive disorder, single episode, unspecified: Secondary | ICD-10-CM | POA: Insufficient documentation

## 2014-10-04 DIAGNOSIS — R109 Unspecified abdominal pain: Secondary | ICD-10-CM | POA: Diagnosis not present

## 2014-10-04 DIAGNOSIS — Z8709 Personal history of other diseases of the respiratory system: Secondary | ICD-10-CM | POA: Diagnosis not present

## 2014-10-04 DIAGNOSIS — Z8742 Personal history of other diseases of the female genital tract: Secondary | ICD-10-CM | POA: Insufficient documentation

## 2014-10-04 DIAGNOSIS — Z87442 Personal history of urinary calculi: Secondary | ICD-10-CM | POA: Diagnosis not present

## 2014-10-04 DIAGNOSIS — Z880 Allergy status to penicillin: Secondary | ICD-10-CM | POA: Diagnosis not present

## 2014-10-04 DIAGNOSIS — R011 Cardiac murmur, unspecified: Secondary | ICD-10-CM | POA: Diagnosis not present

## 2014-10-04 DIAGNOSIS — Z87448 Personal history of other diseases of urinary system: Secondary | ICD-10-CM | POA: Insufficient documentation

## 2014-10-04 DIAGNOSIS — F419 Anxiety disorder, unspecified: Secondary | ICD-10-CM | POA: Insufficient documentation

## 2014-10-04 HISTORY — DX: Disorder of kidney and ureter, unspecified: N28.9

## 2014-10-04 LAB — CBC
HEMATOCRIT: 39.2 % (ref 36.0–46.0)
Hemoglobin: 12.9 g/dL (ref 12.0–15.0)
MCH: 27.9 pg (ref 26.0–34.0)
MCHC: 32.9 g/dL (ref 30.0–36.0)
MCV: 84.7 fL (ref 78.0–100.0)
Platelets: 294 10*3/uL (ref 150–400)
RBC: 4.63 MIL/uL (ref 3.87–5.11)
RDW: 14.9 % (ref 11.5–15.5)
WBC: 8.4 10*3/uL (ref 4.0–10.5)

## 2014-10-04 LAB — I-STAT CHEM 8, ED
BUN: 16 mg/dL (ref 6–23)
Calcium, Ion: 1.16 mmol/L (ref 1.12–1.23)
Chloride: 95 mmol/L — ABNORMAL LOW (ref 96–112)
Creatinine, Ser: 1 mg/dL (ref 0.50–1.10)
GLUCOSE: 108 mg/dL — AB (ref 70–99)
HCT: 42 % (ref 36.0–46.0)
Hemoglobin: 14.3 g/dL (ref 12.0–15.0)
Potassium: 2.8 mmol/L — ABNORMAL LOW (ref 3.5–5.1)
Sodium: 138 mmol/L (ref 135–145)
TCO2: 27 mmol/L (ref 0–100)

## 2014-10-04 LAB — I-STAT BETA HCG BLOOD, ED (MC, WL, AP ONLY): I-stat hCG, quantitative: <5 m[IU]/mL (ref <5)

## 2014-10-04 MED ORDER — METOCLOPRAMIDE HCL 5 MG/ML IJ SOLN
10.0000 mg | Freq: Once | INTRAMUSCULAR | Status: AC
  Administered 2014-10-04: 10 mg via INTRAVENOUS
  Filled 2014-10-04: qty 2

## 2014-10-04 MED ORDER — SODIUM CHLORIDE 0.9 % IV BOLUS (SEPSIS)
1000.0000 mL | Freq: Once | INTRAVENOUS | Status: AC
  Administered 2014-10-04: 1000 mL via INTRAVENOUS

## 2014-10-04 MED ORDER — ONDANSETRON HCL 4 MG/2ML IJ SOLN
4.0000 mg | Freq: Once | INTRAMUSCULAR | Status: AC
  Administered 2014-10-04: 4 mg via INTRAVENOUS
  Filled 2014-10-04: qty 2

## 2014-10-04 MED ORDER — HYDROMORPHONE HCL 1 MG/ML IJ SOLN
1.0000 mg | Freq: Once | INTRAMUSCULAR | Status: AC
  Administered 2014-10-04: 1 mg via INTRAVENOUS
  Filled 2014-10-04: qty 1

## 2014-10-04 MED ORDER — IOHEXOL 300 MG/ML  SOLN
50.0000 mL | Freq: Once | INTRAMUSCULAR | Status: AC | PRN
  Administered 2014-10-04: 50 mL via ORAL

## 2014-10-04 NOTE — ED Notes (Signed)
Pt states took 2mg  Dilaudid around 1930 with zofran, has vomited and is unsure how much meds she absorbed

## 2014-10-04 NOTE — ED Provider Notes (Signed)
CSN: [id number]     Arrival date & time 10/04/14  2136 History   First MD Initiated Contact with Patient 10/04/14 2225     Chief Complaint  Patient presents with  . Flank Pain     (Consider location/radiation/quality/duration/timing/severity/associated sxs/prior Treatment) The history is provided by the patient and medical records. No language interpreter was used.     Beth Spence is a 39 y.o. female  with a hx of depression, recurrent kidney stones presents to the Emergency Department complaining of gradual, persistent, progressively worsening left flank pain onset around 6PM.  Pt reports she had cystoscopy with bilateral diagnostic ureteroscopy and insertion of bilateral ureteral stents with tether on 10/02/14 by Dr. Tresa Moore.   Pt reports she usually has no pain after the procedure so it is unusual for her to have this kind of pain.  She reports her abd is also more bloated than usual.  Pt reports since the procedure her left flank is hurting all the time and she has right flank pain with urination (which is normal for her).  Pt reports she was taking 2mg  Dilaudid q 4hrs for pain with Motrin q 6 hours with adequate relief until 6pm tonight.  Pt reports she attempted to cut back on her Dilaudid this afternoon stretching the next dose to 6 hours.  She reports that she then began vomiting.  She reports it is normal for her vomit with pain.  She denies fever, chills, headache, neck pain, chest pain, SOB, diarrhea, weakness, dizziness, syncope.  Pt reports that nothing makes her better at this time.    Past Medical History  Diagnosis Date  . Depression   . Right ureteral stone   . History of kidney stones   . Right flank pain   . Frequency of urination   . Urgency of urination   . PONV (postoperative nausea and vomiting)   . Heart murmur   . Bronchitis     age 36  . Pneumonia     viral and bacterial at age 28  . Anxiety   . Fibroids   . Renal disorder     kidney stones   Past Surgical  History  Procedure Laterality Date  . Ureteroscopic stone extractions  2003;  2001  X3;  1999  X1  . Extracorporeal shock wave lithotripsy  2012  . Dilatation and curretage with suction  2008    MISSED AB  . Cesarean section  2006  &  2009  . Orif bilateral jaw fx's  1991  . Cystoscopy with retrograde pyelogram, ureteroscopy and stent placement  06/02/2012    Procedure: CYSTOSCOPY WITH RETROGRADE PYELOGRAM, URETEROSCOPY AND STENT PLACEMENT;  Surgeon: Molli Hazard, MD;  Location: [facility];  Service: Urology;  Laterality: Right;   No family history on file. History  Substance Use Topics  . Smoking status: Never Smoker   . Smokeless tobacco: Never Used  . Alcohol Use: No   OB History    No data available     Review of Systems  Constitutional: Negative for fever, diaphoresis, appetite change, fatigue and unexpected weight change.  HENT: Negative for mouth sores.   Eyes: Negative for visual disturbance.  Respiratory: Negative for cough, chest tightness, shortness of breath and wheezing.   Cardiovascular: Negative for chest pain.  Gastrointestinal: Positive for nausea, vomiting and abdominal distention. Negative for abdominal pain, diarrhea and constipation.  Endocrine: Negative for polydipsia, polyphagia and polyuria.  Genitourinary: Positive for flank pain. Negative for dysuria, urgency, frequency  and hematuria.  Musculoskeletal: Negative for back pain and neck stiffness.  Skin: Negative for rash.  Allergic/Immunologic: Negative for immunocompromised state.  Neurological: Negative for syncope, light-headedness and headaches.  Hematological: Does not bruise/bleed easily.  Psychiatric/Behavioral: Negative for sleep disturbance. The patient is not nervous/anxious.       Allergies  Sulfa antibiotics; Amoxil; Cephalexin; Darvocet; Penicillins; Percocet; and Septra  Home Medications   Prior to Admission medications   Medication Sig Start Date End Date Taking? Authorizing  Provider  HYDROmorphone (DILAUDID) 2 MG tablet Take 1 tablet (2 mg total) by mouth every 6 (six) hours as needed for moderate pain or severe pain. Post-operatively 10/02/14  Yes Alexis Frock, MD  lamoTRIgine (LAMICTAL) 200 MG tablet Take 200 mg by mouth at bedtime.    Yes Historical Provider, MD  metoCLOPramide (REGLAN) 10 MG tablet Take 1 tablet (10 mg total) by mouth every 6 (six) hours as needed for nausea or vomiting. 09/26/14  Yes Orlie Dakin, MD  nitrofurantoin, macrocrystal-monohydrate, (MACROBID) 100 MG capsule Take 1 capsule (100 mg total) by mouth 2 (two) times daily. X 4 days to prevent post-op infection 10/02/14  Yes Alexis Frock, MD  OVER THE COUNTER MEDICATION Take 1 tablet by mouth daily.   Yes Historical Provider, MD  QUEtiapine (SEROQUEL) 100 MG tablet Take 100 mg by mouth at bedtime.   Yes Historical Provider, MD  senna-docusate (SENOKOT-S) 8.6-50 MG per tablet Take 1 tablet by mouth 2 (two) times daily. While taking pain meds to prevent constipation 10/02/14   Alexis Frock, MD   BP 111/62 mmHg  Pulse 71  Temp(Src) 97.6 F (36.4 C) (Oral)  Resp 16  Ht 5\' 2"  (1.575 m)  Wt 125 lb (56.7 kg)  BMI 22.86 kg/m2  SpO2 92%  LMP 09/25/2014 Physical Exam  Constitutional: She appears well-developed and well-nourished. No distress.  Awake, alert, nontoxic appearance  HENT:  Head: Normocephalic and atraumatic.  Mouth/Throat: Oropharynx is clear and moist. No oropharyngeal exudate.  Eyes: Conjunctivae are normal. No scleral icterus.  Neck: Normal range of motion. Neck supple.  Cardiovascular: Normal rate, regular rhythm, normal heart sounds and intact distal pulses.   No murmur heard. Pulmonary/Chest: Effort normal and breath sounds normal. No respiratory distress. She has no wheezes.  Equal chest expansion  Abdominal: Soft. Normal appearance and bowel sounds are normal. She exhibits distension. She exhibits no mass. There is tenderness. There is CVA tenderness ( bilateral). There  is no rebound and no guarding.  Musculoskeletal: Normal range of motion. She exhibits no edema.  Lymphadenopathy:    She has no cervical adenopathy.  Neurological: She is alert.  Speech is clear and goal oriented Moves extremities without ataxia  Skin: Skin is warm and dry. She is not diaphoretic.  No rash  Psychiatric: She has a normal mood and affect.  Nursing note and vitals reviewed.   ED Course  Procedures (including critical care time) Labs Review Labs Reviewed  COMPREHENSIVE METABOLIC PANEL - Abnormal; Notable for the following:    Potassium 2.8 (*)    Glucose, Bld 107 (*)    Total Bilirubin 1.5 (*)    GFR calc non Af Amer 74 (*)    GFR calc Af Amer 86 (*)    All other components within normal limits  I-STAT CHEM 8, ED - Abnormal; Notable for the following:    Potassium 2.8 (*)    Chloride 95 (*)    Glucose, Bld 108 (*)    All other components within normal limits  CBC  URINALYSIS, ROUTINE W REFLEX MICROSCOPIC  I-STAT BETA HCG BLOOD, ED ([facility], [facility], [facility] ONLY)    Imaging Review No results found.   EKG Interpretation None      MDM   Final diagnoses:  Abdominal pain  Flank pain  Abdominal pain  Flank pain   Angelis Gates presents with increasing left flank pain and abd bloating after ureteroscopic stone manipulation with basketing and laser-lithotripsy on 10/02/14.  Pt is afebrile and tachycardic on arrival. She arrives in significant pain.    Patient given Dilaudid and Zofran with improvement in her pain. No further emesis here in the emergency department. Will check basic labs and obtain CT scan to assess for stent placement, internal bleeding, abscess or other concerns.  1:01 AM Care transferred to Dr. Linton Flemings who will follow UA and CT scan and dispo accordingly.  Pt's pain is well controlled at this time.    BP 111/62 mmHg  Pulse 71  Temp(Src) 97.6 F (36.4 C) (Oral)  Resp 16  Ht 5\' 2"  (1.575 m)  Wt 125 lb (56.7 kg)  BMI 22.86 kg/m2  SpO2 92%   LMP 09/25/2014   Abigail Butts, PA-C 10/05/14 5035  Kalman Drape, MD 10/05/14 [phone]

## 2014-10-04 NOTE — ED Notes (Signed)
Pt states that she had surgery on her kidney stones on Fri and had 2 stents placed; pt states that she has been fine all weekend and the pain meds have been controlling her pain; pt reports increase this afternoon not able to be controlled by pain meds; N/V; and states "I think something has moved with my stents"

## 2014-10-04 NOTE — ED Notes (Signed)
PA at bedside.

## 2014-10-05 ENCOUNTER — Encounter (HOSPITAL_COMMUNITY): Payer: Self-pay

## 2014-10-05 LAB — URINE MICROSCOPIC-ADD ON

## 2014-10-05 LAB — COMPREHENSIVE METABOLIC PANEL
ALBUMIN: 4.6 g/dL (ref 3.5–5.2)
ALK PHOS: 75 U/L (ref 39–117)
ALT: 19 U/L (ref 0–35)
AST: 22 U/L (ref 0–37)
Anion gap: 8 (ref 5–15)
BUN: 17 mg/dL (ref 6–23)
CHLORIDE: 98 mmol/L (ref 96–112)
CO2: 30 mmol/L (ref 19–32)
Calcium: 9.6 mg/dL (ref 8.4–10.5)
Creatinine, Ser: 0.94 mg/dL (ref 0.50–1.10)
GFR calc Af Amer: 86 mL/min — ABNORMAL LOW (ref >90)
GFR calc non Af Amer: 74 mL/min — ABNORMAL LOW (ref >90)
GLUCOSE: 107 mg/dL — AB (ref 70–99)
POTASSIUM: 2.8 mmol/L — AB (ref 3.5–5.1)
SODIUM: 136 mmol/L (ref 135–145)
Total Bilirubin: 1.5 mg/dL — ABNORMAL HIGH (ref 0.3–1.2)
Total Protein: 7 g/dL (ref 6.0–8.3)

## 2014-10-05 LAB — URINALYSIS, ROUTINE W REFLEX MICROSCOPIC
Glucose, UA: NEGATIVE mg/dL
Ketones, ur: 15 mg/dL — AB
Nitrite: POSITIVE — AB
PH: 7 (ref 5.0–8.0)
Protein, ur: >300 mg/dL — AB
SPECIFIC GRAVITY, URINE: 1.016 (ref 1.005–1.030)
Urobilinogen, UA: 1 mg/dL (ref 0.0–1.0)

## 2014-10-05 MED ORDER — HYDROMORPHONE HCL 1 MG/ML IJ SOLN
1.0000 mg | Freq: Once | INTRAMUSCULAR | Status: AC
  Administered 2014-10-05: 1 mg via INTRAVENOUS
  Filled 2014-10-05: qty 1

## 2014-10-05 MED ORDER — POTASSIUM CHLORIDE CRYS ER 20 MEQ PO TBCR
40.0000 meq | EXTENDED_RELEASE_TABLET | Freq: Once | ORAL | Status: AC
  Administered 2014-10-05: 40 meq via ORAL
  Filled 2014-10-05: qty 2

## 2014-10-05 MED ORDER — HYDROMORPHONE HCL 2 MG PO TABS
2.0000 mg | ORAL_TABLET | Freq: Once | ORAL | Status: AC
  Administered 2014-10-05: 2 mg via ORAL
  Filled 2014-10-05: qty 1

## 2014-10-05 MED ORDER — PROMETHAZINE HCL 25 MG PO TABS: 25.0000 mg | ORAL_TABLET | Freq: Four times a day (QID) | ORAL | Status: DC | PRN

## 2014-10-05 MED ORDER — PROMETHAZINE HCL 25 MG RE SUPP: 25.0000 mg | Freq: Four times a day (QID) | RECTAL | Status: DC | PRN

## 2014-10-05 MED ORDER — POTASSIUM CHLORIDE 10 MEQ/100ML IV SOLN
10.0000 meq | INTRAVENOUS | Status: AC
  Administered 2014-10-05: 10 meq via INTRAVENOUS
  Filled 2014-10-05: qty 100

## 2014-10-05 MED ORDER — IOHEXOL 300 MG/ML  SOLN
100.0000 mL | Freq: Once | INTRAMUSCULAR | Status: AC | PRN
  Administered 2014-10-05: 100 mL via INTRAVENOUS

## 2014-10-05 MED ORDER — ONDANSETRON HCL 4 MG/2ML IJ SOLN
4.0000 mg | Freq: Once | INTRAMUSCULAR | Status: AC
  Administered 2014-10-05: 4 mg via INTRAVENOUS
  Filled 2014-10-05: qty 2

## 2014-10-05 MED ORDER — METOCLOPRAMIDE HCL 5 MG/ML IJ SOLN
10.0000 mg | Freq: Once | INTRAMUSCULAR | Status: AC
  Administered 2014-10-05: 10 mg via INTRAMUSCULAR
  Filled 2014-10-05: qty 2

## 2014-10-05 MED ORDER — METOCLOPRAMIDE HCL 10 MG PO TABS: 10.0000 mg | ORAL_TABLET | Freq: Four times a day (QID) | ORAL | Status: DC | PRN

## 2014-10-05 NOTE — Discharge Instructions (Signed)
Hypokalemia Hypokalemia means that the amount of potassium in the blood is lower than normal.Potassium is a chemical, called an electrolyte, that helps regulate the amount of fluid in the body. It also stimulates muscle contraction and helps nerves function properly.Most of the body's potassium is inside of cells, and only a very small amount is in the blood. Because the amount in the blood is so small, minor changes can be life-threatening. CAUSES  Antibiotics.  Diarrhea or vomiting.  Using laxatives too much, which can cause diarrhea.  Chronic kidney disease.  Water pills (diuretics).  Eating disorders (bulimia).  Low magnesium level.  Sweating a lot. SIGNS AND SYMPTOMS  Weakness.  Constipation.  Fatigue.  Muscle cramps.  Mental confusion.  Skipped heartbeats or irregular heartbeat (palpitations).  Tingling or numbness. DIAGNOSIS  Your health care provider can diagnose hypokalemia with blood tests. In addition to checking your potassium level, your health care provider may also check other lab tests. TREATMENT Hypokalemia can be treated with potassium supplements taken by mouth or adjustments in your current medicines. If your potassium level is very low, you may need to get potassium through a vein (IV) and be monitored in the hospital. A diet high in potassium is also helpful. Foods high in potassium are:  Nuts, such as peanuts and pistachios.  Seeds, such as sunflower seeds and pumpkin seeds.  Peas, lentils, and lima beans.  Whole grain and bran cereals and breads.  Fresh fruit and vegetables, such as apricots, avocado, bananas, cantaloupe, kiwi, oranges, tomatoes, asparagus, and potatoes.  Orange and tomato juices.  Red meats.  Fruit yogurt. HOME CARE INSTRUCTIONS  Take all medicines as prescribed by your health care provider.  Maintain a healthy diet by including nutritious food, such as fruits, vegetables, nuts, whole grains, and lean meats.  If  you are taking a laxative, be sure to follow the directions on the label. SEEK MEDICAL CARE IF:  Your weakness gets worse.  You feel your heart pounding or racing.  You are vomiting or having diarrhea.  You are diabetic and having trouble keeping your blood glucose in the normal range. SEEK IMMEDIATE MEDICAL CARE IF:  You have chest pain, shortness of breath, or dizziness.  You are vomiting or having diarrhea for more than 2 days.  You faint. MAKE SURE YOU:   Understand these instructions.  Will watch your condition.  Will get help right away if you are not doing well or get worse. Document Released: 07/17/2005 Document Revised: 05/07/2013 Document Reviewed: 01/17/2013 [employer] Patient Information 2015 [place], [place]. This information is not intended to replace advice given to you by your health care provider. Make sure you discuss any questions you have with your health care provider.  Preventing Constipation After Surgery Constipation is when a person has fewer than 3 bowel movements a week; has difficulty having a bowel movement; or has stools that are dry, hard, or larger than normal. Many things can make constipation likely after surgery. They include:  Medicines, especially numbing medicines (anesthetics) and very strong pain medicines called narcotics.  Feeling stressed because of the surgery.  Eating different foods than normal.  Being less active. Symptoms of constipation include:  Having fewer than 3 bowel movements a week.  Straining to have a bowel movement.  Having hard, dry, or larger-than-normal stools.  Feeling full or bloated.  Having pain in the lower abdomen.  Not feeling relief after having a bowel movement. HOME CARE INSTRUCTIONS  Diet  Eat foods that have a  lot of fiber. These include fruits, vegetables, whole grains, and beans. Limit foods high in fat and processed sugars. These include french fries, hamburgers, cookies, and candy.  Take  a fiber supplement as directed. If you are not taking a fiber supplement and think that you are not getting enough fiber from foods, talk to your health care provider about adding a fiber supplement to your diet.  Drink clear fluids, especially water. Avoid drinking alcohol, caffeine, and soda. These can make constipation worse.  Drink enough fluids to keep your urine clear or pale yellow. Activity   After surgery, return to your normal activities slowly or when your health care provider says it is okay.  Start walking as soon as you can. Try to go a little farther each day.  Once your health care provider approves, do some sort of regular exercise. This helps prevent constipation. Bowel Movements  Go to the restroom when you have the urge to go. Do not hold it in.  Try drinking something hot to get a bowel movement started.  Keep track of how often you use the restroom. If you miss 2-3 bowel movements, talk to your health care provider about medicines that prevent constipation. Your health care provider may suggest a stool softener, laxative, or fiber supplement.  Only take over-the-counter or prescription medicines as directed by your health care provider.  Do not take other medicines without talking to your health care provider first. If you become constipated and take a medicine to make you have a bowel movement, the problem may get worse. Other kinds of medicine can also make the problem worse. SEEK MEDICAL CARE IF:  You used stool softeners or laxatives and still have not had a bowel movement within 24-48 hours after using them.  You have not had a bowel movement in 3 days. SEEK IMMEDIATE MEDICAL CARE IF:   Your constipation lasts for more than 4 days or gets worse.  You have bright red blood in your stool.  You have abdominal or rectal pain.  You have very bad cramping.  You have thin, pencil-like stools.  You have unexplained weight loss.  You have a fever or  persistent symptoms for more than 2-3 days.  You have a fever and your symptoms suddenly get worse. Document Released: 11/11/2012 Document Revised: 12/01/2013 Document Reviewed: 11/11/2012 [facility] Patient Information 2015 [place], [place]. This information is not intended to replace advice given to you by your health care provider. Make sure you discuss any questions you have with your health care provider.  Flank Pain Flank pain is pain in your side. The flank is the area of your side between your upper belly (abdomen) and your back. Pain in this area can be caused by many different things. [place] care and treatment will depend on the cause of your pain.  Rest as told by your doctor.  Drink enough fluids to keep your pee (urine) clear or pale yellow.  Only take medicine as told by your doctor.  Tell your doctor about any changes in your pain.  Follow up with your doctor. GET HELP RIGHT AWAY IF:   Your pain does not get better with medicine.   You have new symptoms or your symptoms get worse.  Your pain gets worse.   You have belly (abdominal) pain.   You are short of breath.   You always feel sick to your stomach (nauseous).   You keep throwing up (vomiting).   You have puffiness (swelling)  in your belly.   You feel light-headed or you pass out (faint).   You have blood in your pee.  You have a fever or lasting symptoms for more than 2-3 days.  You have a fever and your symptoms suddenly get worse. MAKE SURE YOU:   Understand these instructions.  Will watch your condition.  Will get help right away if you are not doing well or get worse. Document Released: 04/25/2008 Document Revised: 12/01/2013 Document Reviewed: 02/29/2012 [facility] Patient Information 2015 [place], [place]. This information is not intended to replace advice given to you by your health care provider. Make sure you discuss any questions you have with your health care  provider.

## 2014-10-06 ENCOUNTER — Encounter (HOSPITAL_COMMUNITY): Payer: Self-pay | Admitting: Emergency Medicine

## 2014-10-06 ENCOUNTER — Emergency Department (HOSPITAL_COMMUNITY)
Admission: EM | Admit: 2014-10-06 | Discharge: 2014-10-06 | Disposition: A | Discharge status: Home / Self Care | Payer: BLUE CROSS/BLUE SHIELD | Attending: Emergency Medicine | Admitting: Emergency Medicine

## 2014-10-06 DIAGNOSIS — F419 Anxiety disorder, unspecified: Secondary | ICD-10-CM | POA: Insufficient documentation

## 2014-10-06 DIAGNOSIS — Z3202 Encounter for pregnancy test, result negative: Secondary | ICD-10-CM | POA: Insufficient documentation

## 2014-10-06 DIAGNOSIS — Z87442 Personal history of urinary calculi: Secondary | ICD-10-CM | POA: Insufficient documentation

## 2014-10-06 DIAGNOSIS — E860 Dehydration: Secondary | ICD-10-CM | POA: Diagnosis not present

## 2014-10-06 DIAGNOSIS — R000 Tachycardia, unspecified: Secondary | ICD-10-CM | POA: Diagnosis not present

## 2014-10-06 DIAGNOSIS — R011 Cardiac murmur, unspecified: Secondary | ICD-10-CM | POA: Diagnosis not present

## 2014-10-06 DIAGNOSIS — Z880 Allergy status to penicillin: Secondary | ICD-10-CM | POA: Diagnosis not present

## 2014-10-06 DIAGNOSIS — N179 Acute kidney failure, unspecified: Secondary | ICD-10-CM | POA: Insufficient documentation

## 2014-10-06 DIAGNOSIS — Z8701 Personal history of pneumonia (recurrent): Secondary | ICD-10-CM | POA: Insufficient documentation

## 2014-10-06 DIAGNOSIS — Z8742 Personal history of other diseases of the female genital tract: Secondary | ICD-10-CM | POA: Insufficient documentation

## 2014-10-06 DIAGNOSIS — N390 Urinary tract infection, site not specified: Secondary | ICD-10-CM | POA: Diagnosis not present

## 2014-10-06 DIAGNOSIS — F329 Major depressive disorder, single episode, unspecified: Secondary | ICD-10-CM | POA: Diagnosis not present

## 2014-10-06 DIAGNOSIS — Z8709 Personal history of other diseases of the respiratory system: Secondary | ICD-10-CM | POA: Diagnosis not present

## 2014-10-06 DIAGNOSIS — Z79899 Other long term (current) drug therapy: Secondary | ICD-10-CM | POA: Diagnosis not present

## 2014-10-06 DIAGNOSIS — R109 Unspecified abdominal pain: Secondary | ICD-10-CM

## 2014-10-06 LAB — CBC WITH DIFFERENTIAL/PLATELET
Basophils Absolute: 0.1 10*3/uL (ref 0.0–0.1)
Basophils Relative: 1 % (ref 0–1)
Eosinophils Absolute: 0.1 10*3/uL (ref 0.0–0.7)
Eosinophils Relative: 1 % (ref 0–5)
HEMATOCRIT: 44.7 % (ref 36.0–46.0)
Hemoglobin: 14.7 g/dL (ref 12.0–15.0)
Lymphocytes Relative: 14 % (ref 12–46)
Lymphs Abs: 1.3 10*3/uL (ref 0.7–4.0)
MCH: 28.1 pg (ref 26.0–34.0)
MCHC: 32.9 g/dL (ref 30.0–36.0)
MCV: 85.5 fL (ref 78.0–100.0)
MONO ABS: 0.9 10*3/uL (ref 0.1–1.0)
MONOS PCT: 10 % (ref 3–12)
NEUTROS ABS: 7.2 10*3/uL (ref 1.7–7.7)
Neutrophils Relative %: 75 % (ref 43–77)
Platelets: 398 10*3/uL (ref 150–400)
RBC: 5.23 MIL/uL — AB (ref 3.87–5.11)
RDW: 14.9 % (ref 11.5–15.5)
WBC: 9.6 10*3/uL (ref 4.0–10.5)

## 2014-10-06 LAB — COMPREHENSIVE METABOLIC PANEL
ALK PHOS: 81 U/L (ref 39–117)
ALT: 16 U/L (ref 0–35)
ANION GAP: 12 (ref 5–15)
AST: 21 U/L (ref 0–37)
Albumin: 4.8 g/dL (ref 3.5–5.2)
BUN: 25 mg/dL — AB (ref 6–23)
CO2: 25 mmol/L (ref 19–32)
CREATININE: 1.23 mg/dL — AB (ref 0.50–1.10)
Calcium: 10 mg/dL (ref 8.4–10.5)
Chloride: 101 mmol/L (ref 96–112)
GFR calc non Af Amer: 54 mL/min — ABNORMAL LOW (ref >90)
GFR, EST AFRICAN AMERICAN: 62 mL/min — AB (ref >90)
GLUCOSE: 110 mg/dL — AB (ref 70–99)
POTASSIUM: 3.5 mmol/L (ref 3.5–5.1)
Sodium: 138 mmol/L (ref 135–145)
TOTAL PROTEIN: 7.4 g/dL (ref 6.0–8.3)
Total Bilirubin: 1.9 mg/dL — ABNORMAL HIGH (ref 0.3–1.2)

## 2014-10-06 LAB — URINALYSIS, ROUTINE W REFLEX MICROSCOPIC
GLUCOSE, UA: NEGATIVE mg/dL
KETONES UR: 40 mg/dL — AB
Nitrite: POSITIVE — AB
PH: 5 (ref 5.0–8.0)
PROTEIN: 100 mg/dL — AB
Specific Gravity, Urine: 1.015 (ref 1.005–1.030)
Urobilinogen, UA: 2 mg/dL — ABNORMAL HIGH (ref 0.0–1.0)

## 2014-10-06 LAB — POC URINE PREG, ED: Preg Test, Ur: NEGATIVE

## 2014-10-06 LAB — URINE MICROSCOPIC-ADD ON

## 2014-10-06 LAB — LIPASE, BLOOD: LIPASE: 19 U/L (ref 11–59)

## 2014-10-06 MED ORDER — HYDROCODONE-ACETAMINOPHEN 5-325 MG PO TABS: 1.0000 | ORAL_TABLET | Freq: Four times a day (QID) | ORAL | Status: DC | PRN

## 2014-10-06 MED ORDER — METOCLOPRAMIDE HCL 5 MG/ML IJ SOLN
10.0000 mg | Freq: Once | INTRAMUSCULAR | Status: AC
  Administered 2014-10-06: 10 mg via INTRAVENOUS
  Filled 2014-10-06: qty 2

## 2014-10-06 MED ORDER — CIPROFLOXACIN HCL 500 MG PO TABS: 500.0000 mg | ORAL_TABLET | Freq: Two times a day (BID) | ORAL | Status: DC

## 2014-10-06 MED ORDER — SODIUM CHLORIDE 0.9 % IV BOLUS (SEPSIS)
1000.0000 mL | Freq: Once | INTRAVENOUS | Status: AC
  Administered 2014-10-06: 1000 mL via INTRAVENOUS

## 2014-10-06 MED ORDER — MORPHINE SULFATE 4 MG/ML IJ SOLN
4.0000 mg | Freq: Once | INTRAMUSCULAR | Status: AC
  Administered 2014-10-06: 4 mg via INTRAVENOUS
  Filled 2014-10-06: qty 1

## 2014-10-06 MED ORDER — TAMSULOSIN HCL 0.4 MG PO CAPS: 0.4000 mg | ORAL_CAPSULE | Freq: Every day | ORAL | Status: DC

## 2014-10-06 NOTE — ED Notes (Signed)
Pt given ginger ale and ice water per request for fluid challenge.

## 2014-10-06 NOTE — ED Notes (Addendum)
Pt reports bilateral stents placed Friday. Pt reports adjustment in pain medication: dilaudid to Toradol yesterday. Per pt dilaudid causes severe nausea even with zofran use. Pt states pain unrelieved by Toradol. Pt complaint of severe right side flank pain at present time.

## 2014-10-06 NOTE — Discharge Instructions (Signed)
Take 200-400mg  Advil every 8 hours as needed for pain, using vicodin for breakthrough pain. Do not drive or operate machinery with pain medication use. Stop taking toradol! STAY WELL HYDRATED! Use home reglan for nausea/vomiting. May need over-the-counter stool softener with this pain medication use, such as miralax. Use Flomax as directed, as this medication will help your ureters relax. Take this with benadryl 25mg  if hives occur as there is a small chance of allergic reaction with patients allergic to sulfa medications. Followup with urologist at your regularly scheduled appointment on 10/19/14 for recheck of ongoing pain, however for intractable or uncontrollable pain or vomiting at home then return to the emergency department.   Flank Pain Flank pain is pain in your side. The flank is the area of your side between your upper belly (abdomen) and your back. Pain in this area can be caused by many different things. [place] care and treatment will depend on the cause of your pain.  Rest as told by your doctor.  Drink enough fluids to keep your pee (urine) clear or pale yellow.  Only take medicine as told by your doctor.  Tell your doctor about any changes in your pain.  Follow up with your doctor. GET HELP RIGHT AWAY IF:   Your pain does not get better with medicine.   You have new symptoms or your symptoms get worse.  Your pain gets worse.   You have belly (abdominal) pain.   You are short of breath.   You always feel sick to your stomach (nauseous).   You keep throwing up (vomiting).   You have puffiness (swelling) in your belly.   You feel light-headed or you pass out (faint).   You have blood in your pee.  You have a fever or lasting symptoms for more than 2-3 days.  You have a fever and your symptoms suddenly get worse. MAKE SURE YOU:   Understand these instructions.  Will watch your condition.  Will get help right away if you are not doing well  or get worse. Document Released: 04/25/2008 Document Revised: 12/01/2013 Document Reviewed: 02/29/2012 [facility] Patient Information 2015 [place], [place]. This information is not intended to replace advice given to you by your health care provider. Make sure you discuss any questions you have with your health care provider.  Acute Kidney Injury Acute kidney injury is a disease in which there is sudden (acute) damage to the kidneys. The kidneys are 2 organs that lie on either side of the spine between the middle of the back and the front of the abdomen. The kidneys:  Remove wastes and extra water from the blood.   Produce important hormones. These help keep bones strong, regulate blood pressure, and help create red blood cells.   Balance the fluids and chemicals in the blood and tissues. A small amount of kidney damage may not cause problems, but a large amount of damage may make it difficult or impossible for the kidneys to work the way they should. Acute kidney injury may develop into long-lasting (chronic) kidney disease. It may also develop into a life-threatening disease called end-stage kidney disease. Acute kidney injury can get worse very quickly, so it should be treated right away. Early treatment may prevent other kidney diseases from developing.  CAUSES   A problem with blood flow to the kidneys. This may be caused by:   Blood loss.   Heart disease.   Severe burns.   Liver disease.  Direct damage to the  kidneys. This may be caused by:  Some medicines.   A kidney infection.   Poisoning or consuming toxic substances.   A surgical wound.   A blow to the kidney area.   A problem with urine flow. This may be caused by:   Cancer.   Kidney stones.   An enlarged prostate. SYMPTOMS   Swelling (edema) of the legs, ankles, or feet.   Tiredness (lethargy).   Nausea or vomiting.   Confusion.   Problems with urination, such as:   Painful or  burning feeling during urination.   Decreased urine production.   Frequent accidents in children who are potty trained.   Bloody urine.   Muscle twitches and cramps.   Shortness of breath.   Seizures.   Chest pain or pressure. Sometimes, no symptoms are present. DIAGNOSIS Acute kidney injury may be detected and diagnosed by tests, including blood, urine, imaging, or kidney biopsy tests.  TREATMENT Treatment of acute kidney injury varies depending on the cause and severity of the kidney damage. In mild cases, no treatment may be needed. The kidneys may heal on their own. If acute kidney injury is more severe, your caregiver will treat the cause of the kidney damage, help the kidneys heal, and prevent complications from occurring. Severe cases may require a procedure to remove toxic wastes from the body (dialysis) or surgery to repair kidney damage. Surgery may involve:   Repair of a torn kidney.   Removal of an obstruction. Most of the time, you will need to stay overnight at the hospital.  HOME CARE INSTRUCTIONS:  Follow your prescribed diet.  Only take over-the-counter or prescription medicines as directed by your caregiver.  Do not take any new medicines (prescription, over-the-counter, or nutritional supplements) unless approved by your caregiver. Many medicines can worsen your kidney damage or need to have the dose adjusted.   Keep all follow-up appointments as directed by your caregiver.  Observe your condition to make sure you are healing as expected. SEEK IMMEDIATE MEDICAL CARE IF:  You are feeling ill or have severe pain in the back or side.   Your symptoms return or you have new symptoms.  You have any symptoms of end-stage kidney disease. These include:   Persistent itchiness.   Loss of appetite.   Headaches.   Abnormally dark or light skin.  Numbness in the hands or feet.   Easy bruising.   Frequent hiccups.   Menstruation stops.    You have a fever.  You have increased urine production.  You have pain or bleeding when urinating. MAKE SURE YOU:   Understand these instructions.  Will watch your condition.  Will get help right away if you are not doing well or get worse Document Released: 01/30/2011 Document Revised: 11/11/2012 Document Reviewed: 03/15/2012 [facility] Patient Information 2015 [place], [place]. This information is not intended to replace advice given to you by your health care provider. Make sure you discuss any questions you have with your health care provider.  Dehydration, Adult Dehydration means your body does not have as much fluid as it needs. Your kidneys, brain, and heart will not work properly without the right amount of fluids and salt.  HOME CARE  Ask your doctor how to replace body fluid losses (rehydrate).  Drink enough fluids to keep your pee (urine) clear or pale yellow.  Drink small amounts of fluids often if you feel sick to your stomach (nauseous) or throw up (vomit).  Eat like you normally do.  Avoid:  Foods or drinks high in sugar.  Bubbly (carbonated) drinks.  Juice.  Very hot or cold fluids.  Drinks with caffeine.  Fatty, greasy foods.  Alcohol.  Tobacco.  Eating too much.  Gelatin desserts.  Wash your hands to avoid spreading germs (bacteria, viruses).  Only take medicine as told by your doctor.  Keep all doctor visits as told. GET HELP RIGHT AWAY IF:   You cannot drink something without throwing up.  You get worse even with treatment.  Your vomit has blood in it or looks greenish.  Your poop (stool) has blood in it or looks black and tarry.  You have not peed in 6 to 8 hours.  You pee a small amount of very dark pee.  You have a fever.  You pass out (faint).  You have belly (abdominal) pain that gets worse or stays in one spot (localizes).  You have a rash, stiff neck, or bad headache.  You get easily annoyed, sleepy, or are hard  to wake up.  You feel weak, dizzy, or very thirsty. MAKE SURE YOU:   Understand these instructions.  Will watch your condition.  Will get help right away if you are not doing well or get worse. Document Released: 05/13/2009 Document Revised: 10/09/2011 Document Reviewed: 03/06/2011 [facility] Patient Information 2015 [place], [place]. This information is not intended to replace advice given to you by your health care provider. Make sure you discuss any questions you have with your health care provider.  Urinary Tract Infection A urinary tract infection (UTI) can occur any place along the urinary tract. The tract includes the kidneys, ureters, bladder, and urethra. A type of germ called bacteria often causes a UTI. UTIs are often helped with antibiotic medicine.  HOME CARE   If given, take antibiotics as told by your doctor. Finish them even if you start to feel better.  Drink enough fluids to keep your pee (urine) clear or pale yellow.  Avoid tea, drinks with caffeine, and bubbly (carbonated) drinks.  Pee often. Avoid holding your pee in for a long time.  Pee before and after having sex (intercourse).  Wipe from front to back after you poop (bowel movement) if you are a woman. Use each tissue only once. GET HELP RIGHT AWAY IF:   You have back pain.  You have lower belly (abdominal) pain.  You have chills.  You feel sick to your stomach (nauseous).  You throw up (vomit).  Your burning or discomfort with peeing does not go away.  You have a fever.  Your symptoms are not better in 3 days. MAKE SURE YOU:   Understand these instructions.  Will watch your condition.  Will get help right away if you are not doing well or get worse. Document Released: 01/03/2008 Document Revised: 04/10/2012 Document Reviewed: 02/15/2012 [facility] Patient Information 2015 [place], [place]. This information is not intended to replace advice given to you by your health care provider. Make sure  you discuss any questions you have with your health care provider.

## 2014-10-06 NOTE — ED Provider Notes (Signed)
CSN: [id number]     Arrival date & time 10/06/14  1134 History   First MD Initiated Contact with Patient 10/06/14 1228     Chief Complaint  Patient presents with  . Flank Pain    right     (Consider location/radiation/quality/duration/timing/severity/associated sxs/prior Treatment) HPI Comments: Beth Spence is a 39 y.o. female with a PMHx of nephrolithiasis s/p b/l stent placement on 10/02/14, anxiety, depression, and uterine fibroids, who presents to the ED with complaints of right-sided flank pain that began at 9 AM after she removed her stents, following the instructions given by her urologist Dr. Tresa Moore. Patient states that in the past when she has had stent placement, she has had recurrence of pain and ureteral spasm after removal of the stents. She reports today the pain is exactly the same as it has been in the past, reporting that it is 8/10 constant waxing and waning sharp right flank pain, nonradiating, worse with lying flat, improved with standing, and unrelieved with home Toradol given that she has not been able to tolerate anything by mouth since 9 AM. She has had 2 episodes of nonbloody nonbilious emesis, and ongoing nausea. She reports that since the operation 4 days ago, she has not had a bowel movement, but this is normal for her after surgery and with opiate use. She states she is on her menstrual cycle, therefore she is not sure if her urine is blood or if it is menstrual blood. Denies any fevers, chills, chest pain, shortness breath, abdominal pain, diarrhea, obstipation, rectal pain or bleeding, vaginal discharge, dysuria, urinary frequency, numbness, tingling, weakness, cauda equina symptoms, recent travel, or sick contacts.  Patient is a 39 y.o. female presenting with flank pain. The history is provided by the patient. No language interpreter was used.  Flank Pain This is a recurrent problem. The current episode started today. The problem occurs constantly. The problem has been  waxing and waning. Associated symptoms include nausea, urinary symptoms (possible hematuria) and vomiting (2 episodes of NBNB emesis). Pertinent negatives include no abdominal pain, arthralgias, chest pain, chills, fever, myalgias, numbness or weakness. Exacerbated by: laying down. Treatments tried: standing up. The treatment provided mild relief.    Past Medical History  Diagnosis Date  . Depression   . Right ureteral stone   . History of kidney stones   . Right flank pain   . Frequency of urination   . Urgency of urination   . PONV (postoperative nausea and vomiting)   . Heart murmur   . Bronchitis     age 42  . Pneumonia     viral and bacterial at age 74  . Anxiety   . Fibroids   . Renal disorder     kidney stones   Past Surgical History  Procedure Laterality Date  . Ureteroscopic stone extractions  2003;  2001  X3;  1999  X1  . Extracorporeal shock wave lithotripsy  2012  . Dilatation and curretage with suction  2008    MISSED AB  . Cesarean section  2006  &  2009  . Orif bilateral jaw fx's  1991  . Cystoscopy with retrograde pyelogram, ureteroscopy and stent placement  06/02/2012    Procedure: CYSTOSCOPY WITH RETROGRADE PYELOGRAM, URETEROSCOPY AND STENT PLACEMENT;  Surgeon: Molli Hazard, MD;  Location: [facility];  Service: Urology;  Laterality: Right;  . Cystoscopy with retrograde pyelogram, ureteroscopy and stent placement Bilateral 10/02/2014    Procedure: CYSTOSCOPY, BILATERAL RETROGRADE PYELOGRAM, BILATERAL DIAGNOSTIC URETEROSCOPY AND  BILATERAL STENT PLACEMENT;  Surgeon: Phebe Colla, MD;  Location: [facility];  Service: Urology;  Laterality: Bilateral;   No family history on file. History  Substance Use Topics  . Smoking status: Never Smoker   . Smokeless tobacco: Never Used  . Alcohol Use: No   OB History    No data available     Review of Systems  Constitutional: Negative for fever and chills.  Respiratory: Negative for shortness of breath.   Cardiovascular:  Negative for chest pain.  Gastrointestinal: Positive for nausea, vomiting (2 episodes of NBNB emesis) and constipation (no BM in 4 days). Negative for abdominal pain, diarrhea, anal bleeding and rectal pain.  Genitourinary: Positive for hematuria (unsure if vaginal blood vs urinary bleeding), flank pain and vaginal bleeding (on menses). Negative for dysuria, vaginal discharge, vaginal pain and pelvic pain.  Musculoskeletal: Negative for myalgias and arthralgias.  Skin: Negative for color change.  Allergic/Immunologic: Negative for immunocompromised state.  Neurological: Negative for weakness and numbness.  Psychiatric/Behavioral: Negative for confusion.   10 Systems reviewed and are negative for acute change except as noted in the HPI.    Allergies  Sulfa antibiotics; Amoxil; Cephalexin; Darvocet; Penicillins; Percocet; and Septra  Home Medications   Prior to Admission medications   Medication Sig Start Date End Date Taking? Authorizing Provider  HYDROmorphone (DILAUDID) 2 MG tablet Take 1 tablet (2 mg total) by mouth every 6 (six) hours as needed for moderate pain or severe pain. Post-operatively 10/02/14  Yes Alexis Frock, MD  lamoTRIgine (LAMICTAL) 200 MG tablet Take 200 mg by mouth at bedtime.    Yes Historical Provider, MD  metoCLOPramide (REGLAN) 10 MG tablet Take 1 tablet (10 mg total) by mouth every 6 (six) hours as needed for nausea or vomiting. 10/05/14  Yes Linton Flemings, MD  nitrofurantoin, macrocrystal-monohydrate, (MACROBID) 100 MG capsule Take 1 capsule (100 mg total) by mouth 2 (two) times daily. X 4 days to prevent post-op infection 10/02/14  Yes Alexis Frock, MD  QUEtiapine (SEROQUEL) 100 MG tablet Take 100 mg by mouth at bedtime.   Yes Historical Provider, MD  senna-docusate (SENOKOT-S) 8.6-50 MG per tablet Take 1 tablet by mouth 2 (two) times daily. While taking pain meds to prevent constipation 10/02/14  Yes Alexis Frock, MD  OVER THE COUNTER MEDICATION Take 1 tablet by mouth  daily. PreWorkout tablets for metabolism    Historical Provider, MD  promethazine (PHENERGAN) 25 MG suppository Place 1 suppository (25 mg total) rectally every 6 (six) hours as needed for nausea or vomiting. Patient not taking: Reported on 10/06/2014 10/05/14   Linton Flemings, MD  promethazine (PHENERGAN) 25 MG tablet Take 1 tablet (25 mg total) by mouth every 6 (six) hours as needed for nausea. Patient not taking: Reported on 10/06/2014 10/05/14   Linton Flemings, MD   BP 111/82 mmHg  Pulse 128  Temp(Src) 98.7 F (37.1 C) (Oral)  Resp 18  SpO2 96%  LMP 09/25/2014 Physical Exam  Constitutional: She is oriented to person, place, and time. She appears well-developed and well-nourished.  Non-toxic appearance. She appears distressed (pacing room, clutching R flank).  Afebrile, nontoxic, tachycardic, pacing room clutching R flank in pain  HENT:  Head: Normocephalic and atraumatic.  Mouth/Throat: Oropharynx is clear and moist. Mucous membranes are dry.  Very dry mucous membranes  Eyes: Conjunctivae and EOM are normal. Right eye exhibits no discharge. Left eye exhibits no discharge.  Neck: Normal range of motion. Neck supple.  Cardiovascular: Regular rhythm, normal heart sounds and intact  distal pulses.  Tachycardia present.  Exam reveals no gallop and no friction rub.   No murmur heard. Tachycardic, reg rhythm, nl s1/s2, no m/r/g  Pulmonary/Chest: Effort normal and breath sounds normal. No respiratory distress. She has no decreased breath sounds. She has no wheezes. She has no rhonchi. She has no rales.  Abdominal: Soft. Normal appearance. She exhibits no distension. Bowel sounds are decreased. There is no tenderness. There is CVA tenderness (R sided). There is no rigidity, no rebound, no guarding, no tenderness at McBurney's point and negative Murphy's sign.  Soft, NTND, slightly hypoactive bowel sounds although still present throughout, no r/g/r, neg murphy's, neg mcburney's, mild R sided CVA TTP    Musculoskeletal: Normal range of motion.  MAE x4 Strength and sensation grossly intact Distal pulses intact Gait steady  Neurological: She is alert and oriented to person, place, and time. She has normal strength. No sensory deficit.  Skin: Skin is warm, dry and intact. No rash noted.  Psychiatric: She has a normal mood and affect.  Nursing note and vitals reviewed.   ED Course  Procedures (including critical care time) Labs Review Labs Reviewed  CBC WITH DIFFERENTIAL/PLATELET - Abnormal; Notable for the following:    RBC 5.23 (*)    All other components within normal limits  COMPREHENSIVE METABOLIC PANEL - Abnormal; Notable for the following:    Glucose, Bld 110 (*)    BUN 25 (*)    Creatinine, Ser 1.23 (*)    Total Bilirubin 1.9 (*)    GFR calc non Af Amer 54 (*)    GFR calc Af Amer 62 (*)    All other components within normal limits  URINALYSIS, ROUTINE W REFLEX MICROSCOPIC - Abnormal; Notable for the following:    Color, Urine RED (*)    APPearance TURBID (*)    Hgb urine dipstick LARGE (*)    Bilirubin Urine MODERATE (*)    Ketones, ur 40 (*)    Protein, ur 100 (*)    Urobilinogen, UA 2.0 (*)    Nitrite POSITIVE (*)    Leukocytes, UA MODERATE (*)    All other components within normal limits  URINE MICROSCOPIC-ADD ON - Abnormal; Notable for the following:    Bacteria, UA MANY (*)    All other components within normal limits  URINE CULTURE  LIPASE, BLOOD  POC URINE PREG, ED    Imaging Review Ct Abdomen Pelvis W Contrast  10/05/2014   CLINICAL DATA:  Nausea and vomiting. Increased abdominal pain. Recent surgery for kidney stones.  EXAM: CT ABDOMEN AND PELVIS WITH CONTRAST  TECHNIQUE: Multidetector CT imaging of the abdomen and pelvis was performed using the standard protocol following bolus administration of intravenous contrast.  CONTRAST:  128mL OMNIPAQUE IOHEXOL 300 MG/ML  SOLN  COMPARISON:  05/26/2015  FINDINGS: Atelectasis or infiltration in the lung bases,  increasing since prior study.  Sub cm cysts or hemangiomas in the liver. Small focal fatty infiltration adjacent to the falciform ligament. The gallbladder, spleen, pancreas, adrenal glands, abdominal aorta, inferior vena cava, and retroperitoneal lymph nodes are unremarkable. Bilateral ureteral stents are present. There is residual hydronephrosis bilaterally. This may indicate for stent function. The distal pigtail of the right ureteral stent may be in the distal ureter or ureter vesicle junction. Stomach, small bowel, and colon are not abnormally distended. No free air or free fluid in the abdomen.  Pelvis: Fibroids in the uterus, some with calcification. Ovaries are not enlarged. No free or loculated pelvic fluid collections.  Appendix is normal. No destructive bone lesions.  IMPRESSION: Bilateral ureteral stents with persistent hydronephrosis bilaterally. Distal pigtail of the right stent may be in the distal ureter. Uterine fibroids. Atelectasis or infiltration in the lung bases. Multiple sub cm low-attenuation lesions in the liver probably represent cysts or hemangiomas.   Electronically Signed   By: Lucienne Capers M.D.   On: 10/05/2014 01:59     EKG Interpretation None      MDM   Final diagnoses:  Right flank pain  AKI (acute kidney injury)  Dehydration  UTI (lower urinary tract infection)  H/O renal calculi    39 y.o. female with R sided flank pain that started after she removed her ureteral stent around 9am, which she was instructed to do by her urologist (Dr. Tresa Moore). On exam, pacing the room in pain, with mild R CVA TTP, no abdominal tenderness, hypoactive bowel sounds. Admits to being constipation since the surgery, due to narcotics. Tachycardic on exam, likely from dehydration and pain. Will give fluids, reglan, and morphine. Will avoid toradol until lab work returns. Will get u/a and upreg in addition to labs. Will hold on any imaging now since pt just had CT 2 days ago that showed no  obstruction and showed hydronephrosis bilaterally, doubt obstruction (although still on the differential given her constipation). Pt states this feels like her chronic flank pain after removing stents, states she has had prior episodes of needing replacement of stents after the initial one was removed. Will likely call urologist office after labs return to discuss care. Will reassess shortly.   3:15 PM Patient feeling improvement of all symptoms, VS improved after 1L bolus. Upreg neg. U/A still not resulted but lab stating this will result shortly. CBC w/diff WNL. CMP showing mildly elevated BUN/Cr (25/1.23 respectively). Lipase WNL. Will PO challenge and call on-call urologist to discuss sending pt home since she is feeling better vs admission for AKI.   3:26 PM Dr. Louis Meckel returning page, would like her to take low dose advil, flomax, and good hydration and then f/up at her appt with Dr. Tresa Moore on 3/21. U/A resulting after discussion with Dr. Louis Meckel, shows +nitrites, +leuks, 21-50 WBC, TNTC RBC, and many bacteria. Given this finding, will treat for UTI since she had recent urologic manipulation. Doubt pyelo given no leukocytosis or fever. She states she was supposed to be on macrobid post-op but missed a few doses due to n/v, which could explain why she has what appears to be UTI now. Will give cipro 500mg  BID x10 days since pt is allergic to most other antibiotics for UTI, and will send for culture. CrCl 53. Last Uculture in 2013 showed e.coli with pansensitivity. Discussed stopping toradol, and using vicodin PRN pain (since dilaudid at home was causing nausea/vomiting). Discussed using benadryl with flomax if allergic reaction occurs (pt has hives to sulfa meds, therefore possible cross reactivity). Also discussed using miralax to help with BMs with narcotics. Pt has reglan at home. I explained the diagnosis and have given explicit precautions to return to the ER including for any other new or worsening  symptoms. The patient understands and accepts the medical plan as it's been dictated and I have answered their questions. Discharge instructions concerning home care and prescriptions have been given. The patient is STABLE and is discharged to home in good condition.  BP 121/71 mmHg  Pulse 83  Temp(Src) 98.4 F (36.9 C) (Oral)  Resp 18  SpO2 96%  LMP 09/25/2014  Meds ordered this  encounter  Medications  . metoCLOPramide (REGLAN) injection 10 mg    Sig:   . sodium chloride 0.9 % bolus 1,000 mL    Sig:   . morphine 4 MG/ML injection 4 mg    Sig:   . HYDROcodone-acetaminophen (NORCO/VICODIN) 5-325 MG per tablet    Sig: Take 1 tablet by mouth every 6 (six) hours as needed for severe pain.    Dispense:  20 tablet    Refill:  0    Order Specific Question:  Supervising Provider    Answer:  MILLER, BRIAN [[id number]]  . ciprofloxacin (CIPRO) 500 MG tablet    Sig: Take 1 tablet (500 mg total) by mouth 2 (two) times daily. One po bid x 10 days    Dispense:  20 tablet    Refill:  0    Order Specific Question:  Supervising Provider    Answer:  Sabra Heck, BRIAN [[id number]]  . tamsulosin (FLOMAX) 0.4 MG CAPS capsule    Sig: Take 1 capsule (0.4 mg total) by mouth daily after supper. Take with Benadryl 25mg     Dispense:  15 capsule    Refill:  0    Order Specific Question:  Supervising Provider    Answer:  Noemi Chapel [[id number]]     Aarti Mankowski Camprubi-Soms, PA-C 10/06/14 [place], MD 10/07/14 1209

## 2014-10-06 NOTE — ED Notes (Signed)
Pt states that she has been here a lot due to kidney stones. Pt states that she is having severe right flank pain. Pt was told that she might need to get her stents out bc that me making the pain worse.

## 2014-10-06 NOTE — ED Notes (Signed)
Pt stated she could not urinate right now, will reassess in 30 min

## 2014-10-06 NOTE — ED Notes (Signed)
Pt attempting urine specimen at present time.

## 2014-10-06 NOTE — ED Notes (Signed)
Pt unable to provide urine sample at present time; will reassess.

## 2014-10-07 LAB — URINE CULTURE: SPECIAL REQUESTS: NORMAL

## 2016-02-23 IMAGING — CT CT ABD-PELV W/ CM
1 of 3 series · 13 of 32 positions shown, 19 images · IV contrast (100 ML OMNI 300)
Comparison: 05/26/2015

CLINICAL DATA: Nausea and vomiting. Increased abdominal pain.
Recent surgery for kidney stones.

EXAM:
CT ABDOMEN AND PELVIS WITH CONTRAST
TECHNIQUE: Multidetector CT imaging of the abdomen and pelvis was performed
using the standard protocol following bolus administration of
intravenous contrast.
CONTRAST:  100mL OMNIPAQUE IOHEXOL 300 MG/ML  SOLN

[Series 2: abd/pel with · axial · 0.61mm/px · z∈[+940,+1270]mm · 13 of 78 slices shown, 19 images]
[im 6/78  soft-tissue]
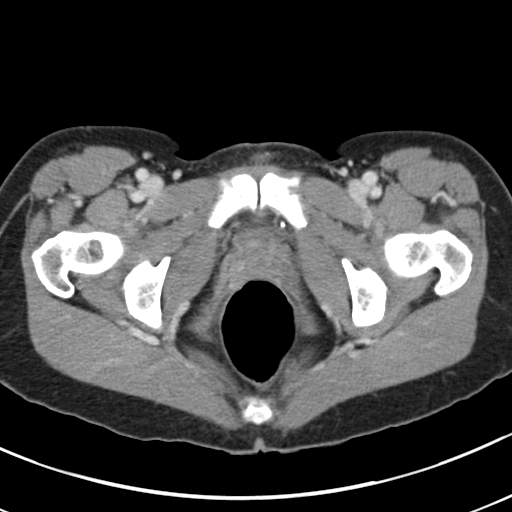
[im 6/78  bone]
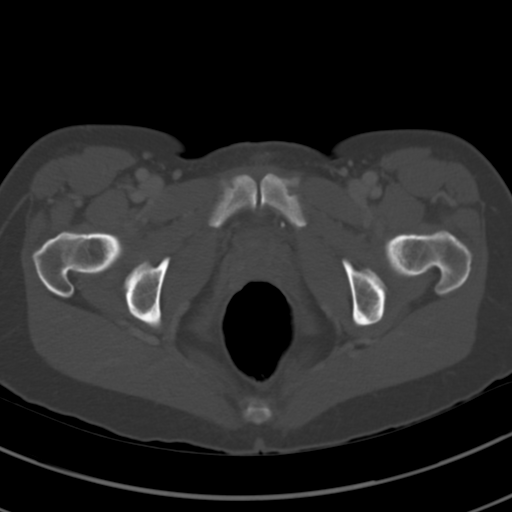
[im 11/78  soft-tissue]
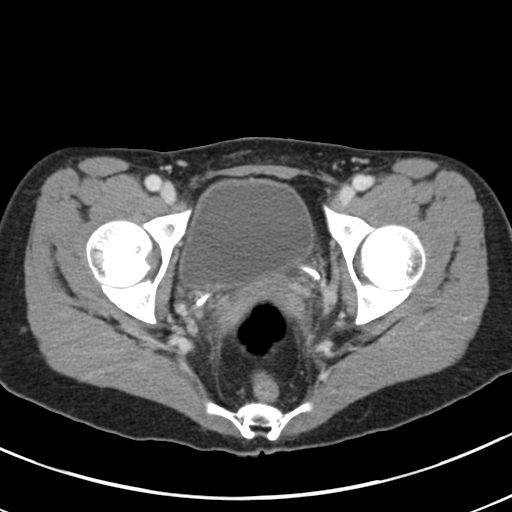
[im 16/78  soft-tissue]
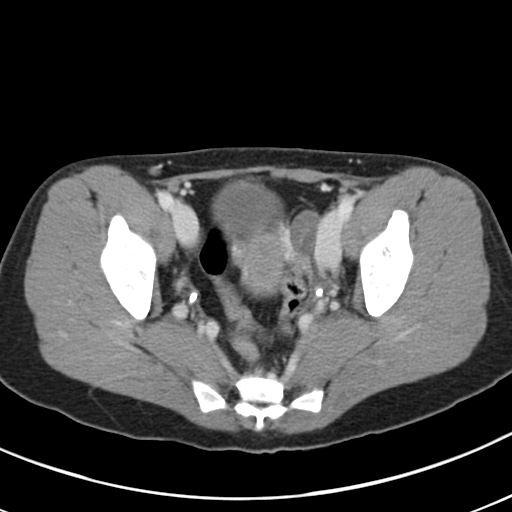
[im 21/78  soft-tissue]
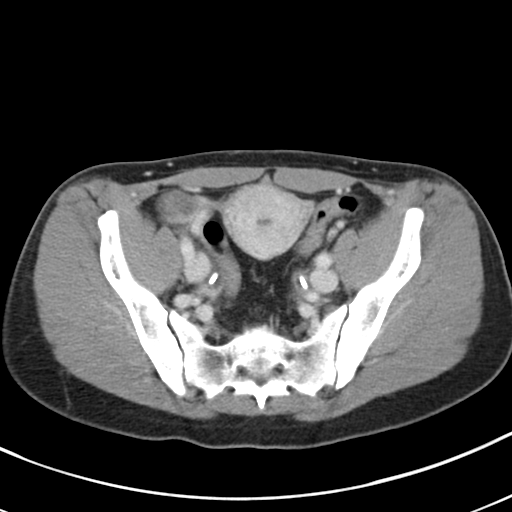
[im 26/78  soft-tissue]
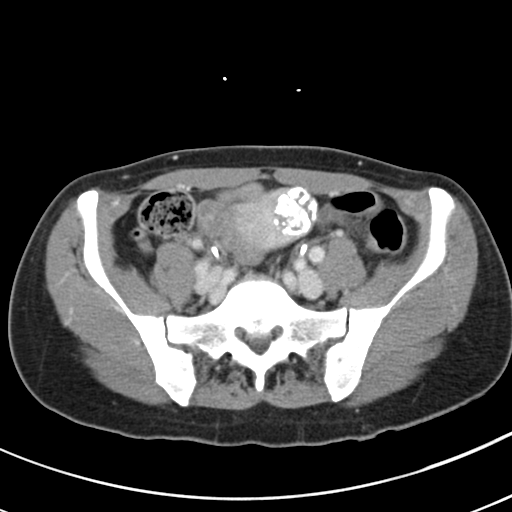
[im 31/78  soft-tissue]
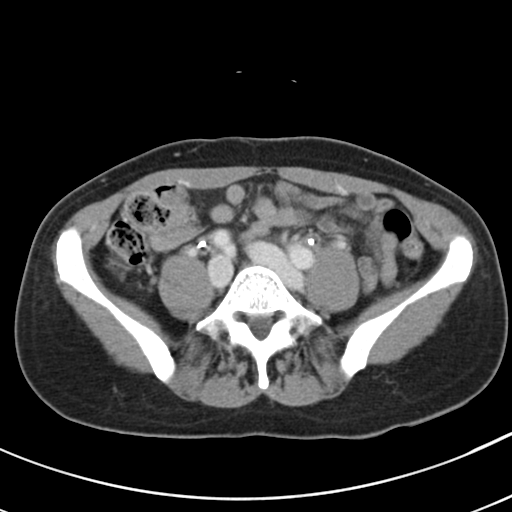
[im 42/78  soft-tissue]
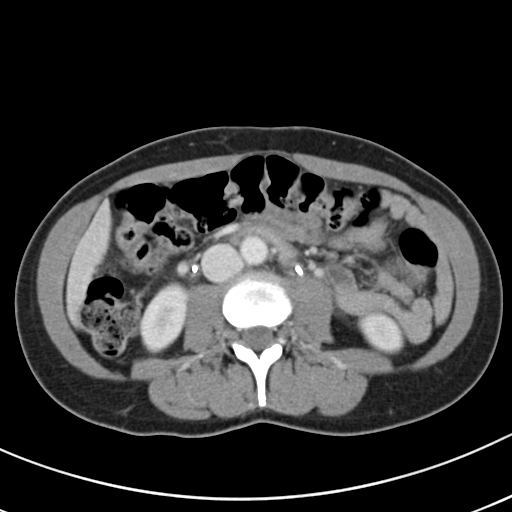
[im 47/78  soft-tissue]
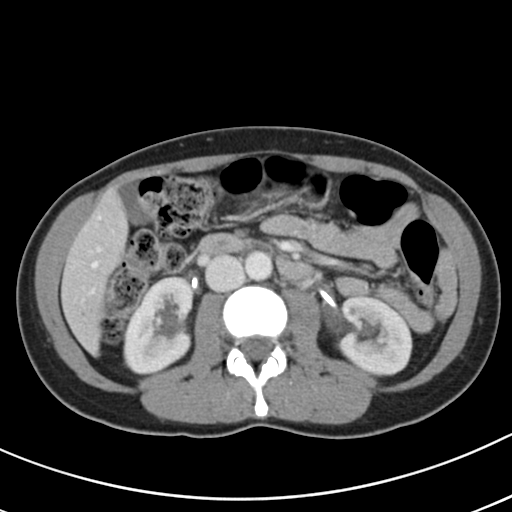
[im 52/78  soft-tissue]
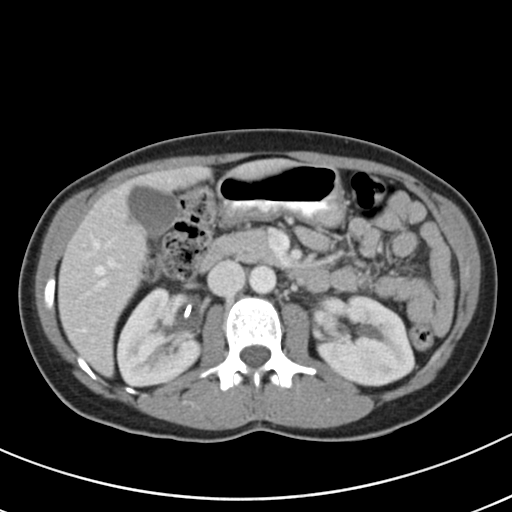
[im 52/78  bone]
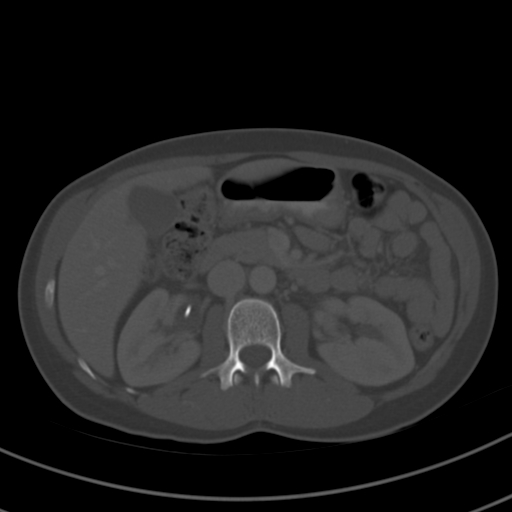
[im 57/78  soft-tissue]
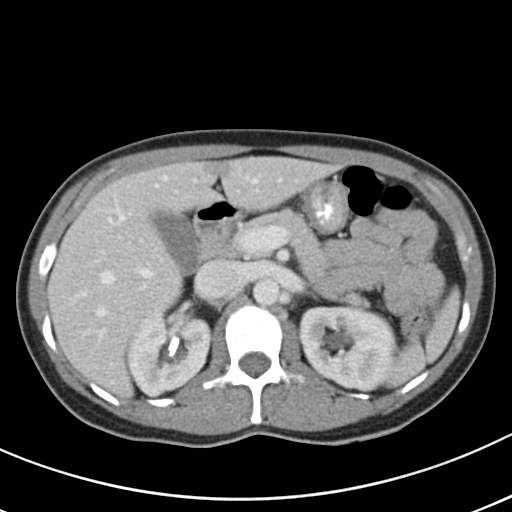
[im 57/78  lung]
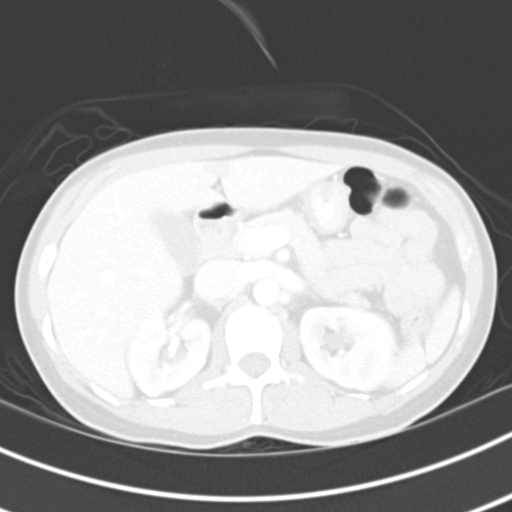
[im 62/78  soft-tissue]
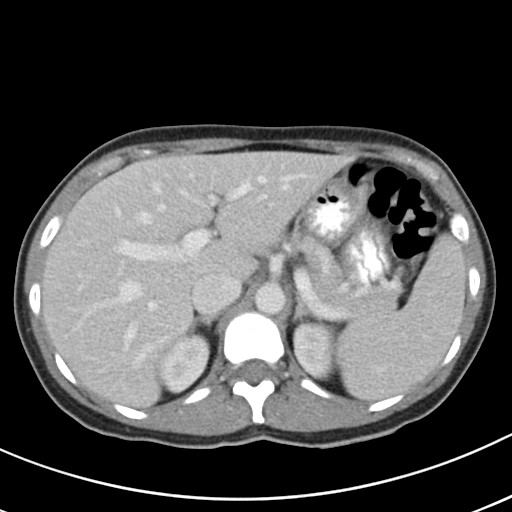
[im 62/78  lung]
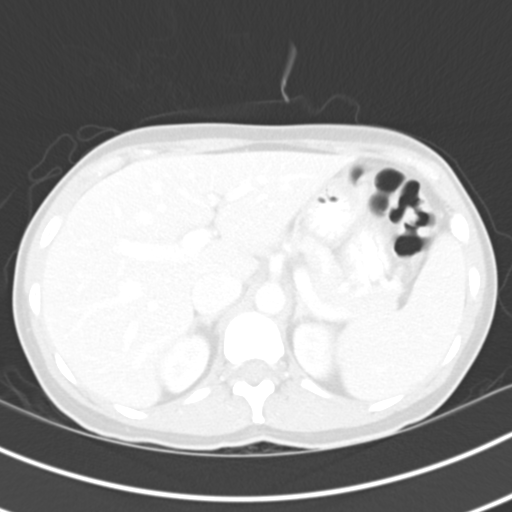
[im 67/78  soft-tissue]
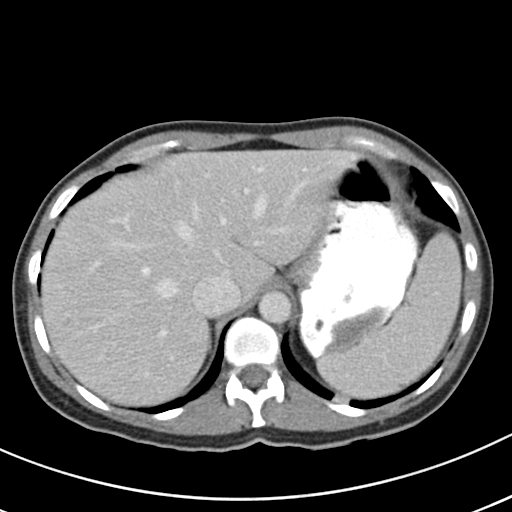
[im 67/78  lung]
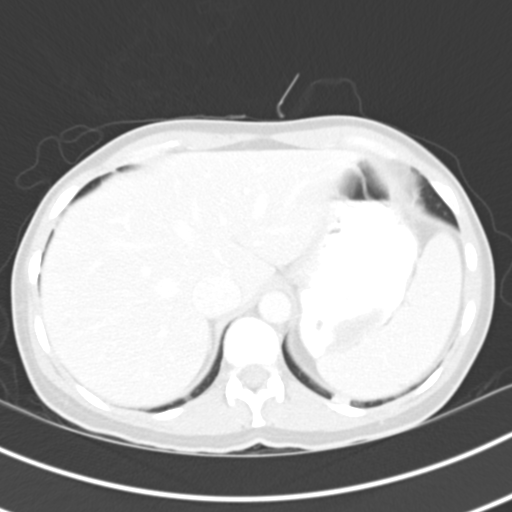
[im 72/78  soft-tissue]
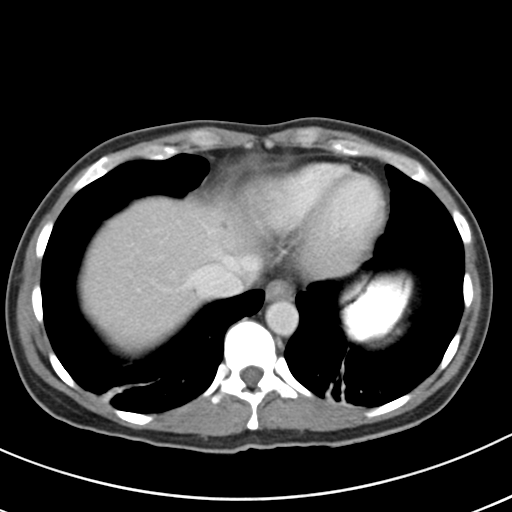
[im 72/78  lung]
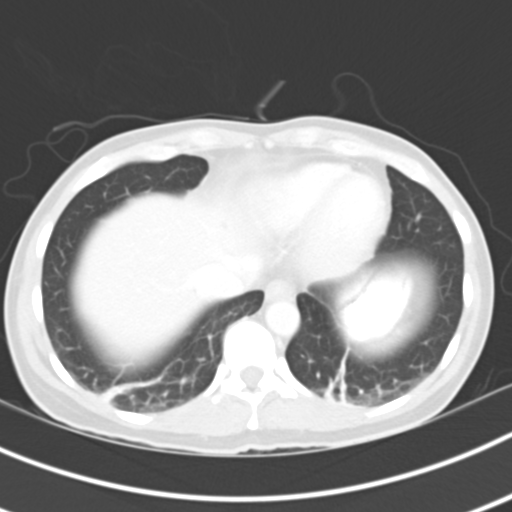

[13 of 32 positions shown; findings below may reference images not displayed]

FINDINGS: Atelectasis or infiltration in the lung bases, increasing since
prior study.

Sub cm cysts or hemangiomas in the liver. Small focal fatty
infiltration adjacent to the falciform ligament. The gallbladder,
spleen, pancreas, adrenal glands, abdominal aorta, inferior vena
cava, and retroperitoneal lymph nodes are unremarkable. Bilateral
ureteral stents are present. There is residual hydronephrosis
bilaterally. This may indicate for stent function. The distal
pigtail of the right ureteral stent may be in the distal ureter or
ureter vesicle junction. Stomach, small bowel, and colon are not
abnormally distended. No free air or free fluid in the abdomen.

Pelvis: Fibroids in the uterus, some with calcification. Ovaries are
not enlarged. No free or loculated pelvic fluid collections.
Appendix is normal. No destructive bone lesions.
IMPRESSION: Bilateral ureteral stents with persistent hydronephrosis
bilaterally. Distal pigtail of the right stent may be in the distal
ureter. Uterine fibroids. Atelectasis or infiltration in the lung
bases. Multiple sub cm low-attenuation lesions in the liver probably
represent cysts or hemangiomas.

## 2016-12-15 ENCOUNTER — Other Ambulatory Visit: Payer: Self-pay | Admitting: Obstetrics and Gynecology

## 2016-12-15 DIAGNOSIS — Z1231 Encounter for screening mammogram for malignant neoplasm of breast: Secondary | ICD-10-CM

## 2016-12-26 ENCOUNTER — Ambulatory Visit: Payer: Self-pay | Admitting: Obstetrics and Gynecology

## 2016-12-28 ENCOUNTER — Telehealth: Payer: Self-pay

## 2016-12-28 ENCOUNTER — Encounter: Payer: Self-pay | Admitting: General Surgery

## 2016-12-28 ENCOUNTER — Ambulatory Visit (INDEPENDENT_AMBULATORY_CARE_PROVIDER_SITE_OTHER): Payer: BLUE CROSS/BLUE SHIELD | Admitting: General Surgery

## 2016-12-28 VITALS — BP 115/73 | HR 76 | Temp 97.9°F | Ht 63.0 in | Wt 128.0 lb

## 2016-12-28 DIAGNOSIS — Z711 Person with feared health complaint in whom no diagnosis is made: Secondary | ICD-10-CM | POA: Diagnosis not present

## 2016-12-28 NOTE — Patient Instructions (Signed)
Please continue with your Mammogram appointment. If you notice any chances, please give [place] call.

## 2016-12-28 NOTE — Telephone Encounter (Signed)
Spoke with Nurse from [facility] at this time. Patient has new Left Chest Wall mass that is not painful or red, no drainage currently. Nurse states that this lump is just left of Xiphoid.  Patient placed on schedule for today to see Dr. Adonis Huguenin. Nurse states that she will make patient aware of appointment.

## 2016-12-28 NOTE — Progress Notes (Signed)
Patient ID: Beth Spence, female   DOB: January 17, 1976, 41 y.o.   MRN: [id number]  CC: Lump at top of abdomen  HPI Beth Spence is a 41 y.o. female who presents to clinic today for evaluation for a lump. Her abdomen. Patient reports that she had a recent bug bite near the area and she was scratching and she felt something hard between her ribs at the top of her abdomen. She never noticed it before. It has not changed in size. It is never been red, inflamed, drained. He does not change in size with position or Valsalva. Otherwise she has no complaints. She denies any fevers, chills, nausea, vomiting, chest pain, shortness breath, diarrhea, constipation.  HPI  Past Medical History:  Diagnosis Date  . Anxiety   . Bronchitis    age 25  . Depression   . Fibroids   . Frequency of urination   . Heart murmur   . History of kidney stones   . Pneumonia    viral and bacterial at age 70  . PONV (postoperative nausea and vomiting)   . Renal disorder    kidney stones  . Right flank pain   . Right ureteral stone   . Urgency of urination     Past Surgical History:  Procedure Laterality Date  . CESAREAN SECTION  2006  &  2009  . CYSTOSCOPY WITH RETROGRADE PYELOGRAM, URETEROSCOPY AND STENT PLACEMENT  06/02/2012   Procedure: CYSTOSCOPY WITH RETROGRADE PYELOGRAM, URETEROSCOPY AND STENT PLACEMENT;  Surgeon: Molli Hazard, MD;  Location: [facility];  Service: Urology;  Laterality: Right;  . CYSTOSCOPY WITH RETROGRADE PYELOGRAM, URETEROSCOPY AND STENT PLACEMENT Bilateral 10/02/2014   Procedure: CYSTOSCOPY, BILATERAL RETROGRADE PYELOGRAM, BILATERAL DIAGNOSTIC URETEROSCOPY AND BILATERAL STENT PLACEMENT;  Surgeon: Phebe Colla, MD;  Location: [facility];  Service: Urology;  Laterality: Bilateral;  . DILATATION AND CURRETAGE WITH SUCTION  2008   MISSED AB  . EXTRACORPOREAL SHOCK WAVE LITHOTRIPSY  2012  . ORIF BILATERAL JAW FX'S  1991  . URETEROSCOPIC STONE EXTRACTIONS  2003;  2001  X3;  1999  X1    Family  History  Problem Relation Age of Onset  . Healthy Mother   . Healthy Father   . Colon cancer Maternal Uncle   . Ovarian cancer Maternal Grandmother   . Rectal cancer Maternal Grandmother   . Stomach cancer Maternal Grandmother   . Colon cancer Maternal Grandmother   . Heart disease Paternal Grandmother   . Liver disease Paternal Grandfather     Social History Social History  Substance Use Topics  . Smoking status: Never Smoker  . Smokeless tobacco: Never Used  . Alcohol use No    Allergies  Allergen Reactions  . Sulfa Antibiotics Hives and Other (See Comments)    Mouth blisters/  ANY SULFADERIVATIVES - MOUTH BLISTERS  . Amoxil [Amoxicillin] Hives  . Cephalexin Hives and Other (See Comments)    MOUTH BLISTERS  . Darvocet [Propoxyphene N-Acetaminophen] Hives  . Penicillins Hives  . Percocet [Oxycodone-Acetaminophen] Hives  . Septra [Sulfamethoxazole-Trimethoprim] Hives and Other (See Comments)    MOUTH BLISTERS    Current Outpatient Prescriptions  Medication Sig Dispense Refill  . lamoTRIgine (LAMICTAL) 200 MG tablet Take 200 mg by mouth at bedtime.     Marland Kitchen QUEtiapine (SEROQUEL) 100 MG tablet Take 100 mg by mouth at bedtime.     No current facility-administered medications for this visit.      Review of Systems A Multi-point review of systems was asked and was  negative except for the findings documented in history of present illness  Physical Exam Blood pressure 115/73, pulse 76, temperature 97.9 F (36.6 C), temperature source Oral, height 5\' 3"  (1.6 m), weight 58.1 kg (128 lb). CONSTITUTIONAL: No acute distress. EYES: Pupils are equal, round, and reactive to light, Sclera are non-icteric. EARS, NOSE, MOUTH AND THROAT: The oropharynx is clear. The oral mucosa is pink and moist. Hearing is intact to voice. LYMPH NODES:  Lymph nodes in the neck are normal. RESPIRATORY:  Lungs are clear. There is normal respiratory effort, with equal breath sounds bilaterally, and  without pathologic use of accessory muscles. CARDIOVASCULAR: Heart is regular without murmurs, gallops, or rubs. GI: The abdomen is soft, nontender, and nondistended. There are no palpable masses. There is no hepatosplenomegaly. There are normal bowel sounds in all quadrants. The area of concern by the patient was the bottom of her xiphoid process GU: Rectal deferred.   MUSCULOSKELETAL: Normal muscle strength and tone. No cyanosis or edema.   SKIN: Turgor is good and there are no pathologic skin lesions or ulcers. NEUROLOGIC: Motor and sensation is grossly normal. Cranial nerves are grossly intact. PSYCH:  Oriented to person, place and time. Affect is normal.  Data Reviewed No images and labs to review I have personally reviewed the patient's imaging, laboratory findings and medical records.    Assessment    Normal anatomy    Plan    41 year old female who presents to the surgery clinic for evaluation of a bulge in his upper abdomen. Upon examination the area of concern for the patient is her xiphoid process. It is definitely connected to her manubrium and does not change with Valsalva or position. Discussed with the patient this is a normal anatomy and nothing or not. Counseled patient that should a change in size or develop any other symptoms will be happy to reevaluate her. No indications for any surgical intervention.     Time spent with the patient was 20 minutes, with more than 50% of the time spent in face-to-face education, counseling and care coordination.     Clayburn Pert, MD FACS General Surgeon 12/28/2016, 3:27 PM

## 2017-01-05 ENCOUNTER — Other Ambulatory Visit: Payer: Self-pay | Admitting: Obstetrics and Gynecology

## 2017-01-05 DIAGNOSIS — Z369 Encounter for antenatal screening, unspecified: Secondary | ICD-10-CM

## 2017-01-23 NOTE — H&P (Signed)
Beth Spence is a 41 y.o. female here for Referred by [facility] Smoker for missed ab .pt with unknown LMP and quants 91,000+ 97,000 U/s on 01/16/17 shows +gest sac with + yolk sac and no IUP .  BT O+   Past Medical History:  has a past medical history of Depression, unspecified and Kidney stones.  Past Surgical History:  has a past surgical history that includes Cesarean section; Dilation and curettage of uterus; jaw surgery; and Lithotripsy. Family History: family history includes Ovarian cancer in her maternal grandmother; Rectal cancer in her maternal grandmother; Stomach cancer in her maternal grandmother; Uterine cancer in her maternal grandmother. Social History:  reports that she has never smoked. She has never used smokeless tobacco. She reports that she does not drink alcohol or use drugs. OB/GYN History:          OB History    Gravida Para Term Preterm AB Living   4 2 2   1 2    SAB TAB Ectopic Molar Multiple Live Births   1         2      Allergies: is allergic to penicillin and sulfa (sulfonamide antibiotics). Medications:  Current Outpatient Prescriptions:  .  INDAPAMIDE ORAL, Take by mouth., Disp: , Rfl:  .  lamoTRIgine (LAMICTAL) 200 MG tablet, Take 200 mg by mouth 2 (two) times daily., Disp: , Rfl:  .  QUEtiapine (SEROQUEL) 100 MG tablet, Take 150 mg by mouth nightly., Disp: , Rfl:   Review of Systems: General:                      No fatigue or weight loss Eyes:                           No vision changes Ears:                            No hearing difficulty Respiratory:                No cough or shortness of breath Pulmonary:                  No asthma or shortness of breath Cardiovascular:           No chest pain, palpitations, dyspnea on exertion Gastrointestinal:          No abdominal bloating, chronic diarrhea, constipations, masses, pain or hematochezia Genitourinary:             No hematuria, dysuria, abnormal vaginal discharge, pelvic pain,  Menometrorrhagia Lymphatic:                   No swollen lymph nodes Musculoskeletal:         No muscle weakness Neurologic:                  No extremity weakness, syncope, seizure disorder Psychiatric:                  No history of depression, delusions or suicidal/homicidal ideation    Exam:      Vitals:   01/23/17 1044  BP: 110/76  Pulse: 80    Body mass index is 23.59 kg/m.  WDWN white/ female in NAD   Lungs: CTA  CV : RRR without murmur   Neck:  no thyromegaly Abdomen: soft , no mass, normal active bowel sounds,  non-tender, no rebound tenderness Pelvic: tanner stage 5 ,  External genitalia: vulva /labia no lesions Urethra: no prolapse Vagina: normal physiologic d/c Cervix: no lesions, no cervical motion tenderness   Uterus: 7 weeks, non-tender Adnexa: no mass,  non-tender   Rectovaginal:   Impression:   The encounter diagnosis was Missed abortion.    Plan:  Offered expectant management  Vs  cytotec vs surgery . Pt opts for the latter Benefits and risks to surgery: suction D+C The proposed benefit of the surgery has been discussed with the patient. The possible risks include, but are not limited to: organ injury to the bowel , bladder, ureters, and major blood vessels and nerves. There is a possibility of additional surgeries resulting from these injuries. There is also the risk of blood transfusion and the need to receive blood products during or after the procedure which may rarely lead to HIV or Hepatitis C infection. There is a risk of developing a deep venous thrombosis or a pulmonary embolism . There is the possibility of wound infection and also anesthetic complications, even the rare possibility of death. The patient understands these risks and wishes to proceed. All questions have been answered and the consent has been signed.  No orders of the defined types were placed in this encounter.   Return in about 2 weeks (around 02/06/2017) for  post op.  Caroline Sauger, MD

## 2017-01-24 ENCOUNTER — Ambulatory Visit: Payer: BLUE CROSS/BLUE SHIELD | Admitting: Registered Nurse

## 2017-01-24 ENCOUNTER — Encounter: Payer: Self-pay | Admitting: *Deleted

## 2017-01-24 ENCOUNTER — Ambulatory Visit
Admission: RE | Admit: 2017-01-24 | Discharge: 2017-01-24 | Disposition: A | Discharge status: Home / Self Care | Payer: BLUE CROSS/BLUE SHIELD | Source: Ambulatory Visit | Attending: Obstetrics and Gynecology | Admitting: Obstetrics and Gynecology

## 2017-01-24 ENCOUNTER — Encounter
Admission: RE | Disposition: A | Discharge status: Home / Self Care | Payer: Self-pay | Source: Ambulatory Visit | Attending: Obstetrics and Gynecology

## 2017-01-24 DIAGNOSIS — R011 Cardiac murmur, unspecified: Secondary | ICD-10-CM | POA: Diagnosis not present

## 2017-01-24 DIAGNOSIS — F419 Anxiety disorder, unspecified: Secondary | ICD-10-CM | POA: Insufficient documentation

## 2017-01-24 DIAGNOSIS — Z800 Family history of malignant neoplasm of digestive organs: Secondary | ICD-10-CM | POA: Diagnosis not present

## 2017-01-24 DIAGNOSIS — Z882 Allergy status to sulfonamides status: Secondary | ICD-10-CM | POA: Diagnosis not present

## 2017-01-24 DIAGNOSIS — Z880 Allergy status to penicillin: Secondary | ICD-10-CM | POA: Insufficient documentation

## 2017-01-24 DIAGNOSIS — O021 Missed abortion: Secondary | ICD-10-CM | POA: Diagnosis not present

## 2017-01-24 DIAGNOSIS — F329 Major depressive disorder, single episode, unspecified: Secondary | ICD-10-CM | POA: Insufficient documentation

## 2017-01-24 DIAGNOSIS — Z8041 Family history of malignant neoplasm of ovary: Secondary | ICD-10-CM | POA: Insufficient documentation

## 2017-01-24 DIAGNOSIS — Z87442 Personal history of urinary calculi: Secondary | ICD-10-CM | POA: Insufficient documentation

## 2017-01-24 HISTORY — PX: DILATION AND EVACUATION: SHX1459

## 2017-01-24 LAB — CBC
HCT: 41.2 % (ref 35.0–47.0)
Hemoglobin: 14.1 g/dL (ref 12.0–16.0)
MCH: 28.3 pg (ref 26.0–34.0)
MCHC: 34.3 g/dL (ref 32.0–36.0)
MCV: 82.5 fL (ref 80.0–100.0)
PLATELETS: 303 10*3/uL (ref 150–440)
RBC: 5 MIL/uL (ref 3.80–5.20)
RDW: 17.4 % — ABNORMAL HIGH (ref 11.5–14.5)
WBC: 6 10*3/uL (ref 3.6–11.0)

## 2017-01-24 LAB — BASIC METABOLIC PANEL
ANION GAP: 6 (ref 5–15)
BUN: 10 mg/dL (ref 6–20)
CO2: 25 mmol/L (ref 22–32)
Calcium: 9.2 mg/dL (ref 8.9–10.3)
Chloride: 100 mmol/L — ABNORMAL LOW (ref 101–111)
Creatinine, Ser: 0.71 mg/dL (ref 0.44–1.00)
GFR calc Af Amer: >60 mL/min (ref >60)
Glucose, Bld: 92 mg/dL (ref 65–99)
POTASSIUM: 2.9 mmol/L — AB (ref 3.5–5.1)
SODIUM: 131 mmol/L — AB (ref 135–145)

## 2017-01-24 LAB — TYPE AND SCREEN
ABO/RH(D): O POS
ANTIBODY SCREEN: NEGATIVE

## 2017-01-24 LAB — PREGNANCY, URINE: PREG TEST UR: POSITIVE — AB

## 2017-01-24 SURGERY — DILATION AND EVACUATION, UTERUS
Anesthesia: General | Wound class: Clean Contaminated

## 2017-01-24 MED ORDER — POTASSIUM CHLORIDE CRYS ER 20 MEQ PO TBCR
20.0000 meq | EXTENDED_RELEASE_TABLET | Freq: Once | ORAL | Status: AC
Start: 2017-01-24 — End: 2017-01-24
  Administered 2017-01-24: 20 meq via ORAL
  Filled 2017-01-24: qty 1

## 2017-01-24 MED ORDER — POTASSIUM CHLORIDE 20 MEQ PO PACK
20.0000 meq | PACK | Freq: Once | ORAL | Status: DC
  Filled 2017-01-24: qty 1

## 2017-01-24 MED ORDER — MIDAZOLAM HCL 2 MG/2ML IJ SOLN
INTRAMUSCULAR | Status: DC | PRN
  Administered 2017-01-24: 2 mg via INTRAVENOUS

## 2017-01-24 MED ORDER — FENTANYL CITRATE (PF) 100 MCG/2ML IJ SOLN
INTRAMUSCULAR | Status: AC
  Filled 2017-01-24: qty 2

## 2017-01-24 MED ORDER — LIDOCAINE HCL (PF) 2 % IJ SOLN
INTRAMUSCULAR | Status: AC
  Filled 2017-01-24: qty 2

## 2017-01-24 MED ORDER — SOD CITRATE-CITRIC ACID 500-334 MG/5ML PO SOLN
30.0000 mL | ORAL | Status: DC
  Filled 2017-01-24: qty 30

## 2017-01-24 MED ORDER — FENTANYL CITRATE (PF) 100 MCG/2ML IJ SOLN
INTRAMUSCULAR | Status: DC | PRN
  Administered 2017-01-24: 50 ug via INTRAVENOUS
  Administered 2017-01-24: 25 ug via INTRAVENOUS

## 2017-01-24 MED ORDER — PROPOFOL 10 MG/ML IV BOLUS
INTRAVENOUS | Status: DC | PRN
  Administered 2017-01-24: 70 mg via INTRAVENOUS
  Administered 2017-01-24: 200 mg via INTRAVENOUS

## 2017-01-24 MED ORDER — DEXAMETHASONE SODIUM PHOSPHATE 10 MG/ML IJ SOLN
INTRAMUSCULAR | Status: AC
  Filled 2017-01-24: qty 1

## 2017-01-24 MED ORDER — SCOPOLAMINE 1 MG/3DAYS TD PT72
1.0000 | MEDICATED_PATCH | Freq: Once | TRANSDERMAL | Status: DC
  Administered 2017-01-24: 1.5 mg via TRANSDERMAL

## 2017-01-24 MED ORDER — METHYLERGONOVINE MALEATE 0.2 MG/ML IJ SOLN
INTRAMUSCULAR | Status: AC
  Filled 2017-01-24: qty 1

## 2017-01-24 MED ORDER — FAMOTIDINE 20 MG PO TABS
ORAL_TABLET | ORAL | Status: AC
  Administered 2017-01-24: 20 mg via ORAL
  Filled 2017-01-24: qty 1

## 2017-01-24 MED ORDER — FAMOTIDINE 20 MG PO TABS
20.0000 mg | ORAL_TABLET | Freq: Once | ORAL | Status: AC
  Administered 2017-01-24: 20 mg via ORAL

## 2017-01-24 MED ORDER — SCOPOLAMINE 1 MG/3DAYS TD PT72
MEDICATED_PATCH | TRANSDERMAL | Status: AC
  Administered 2017-01-24: 1.5 mg via TRANSDERMAL
  Filled 2017-01-24: qty 1

## 2017-01-24 MED ORDER — MEPERIDINE HCL 50 MG/ML IJ SOLN
INTRAMUSCULAR | Status: AC
  Administered 2017-01-24: 12.5 mg via INTRAVENOUS
  Filled 2017-01-24: qty 1

## 2017-01-24 MED ORDER — FENTANYL CITRATE (PF) 100 MCG/2ML IJ SOLN
25.0000 ug | INTRAMUSCULAR | Status: DC | PRN
  Administered 2017-01-24 (×4): 25 ug via INTRAVENOUS

## 2017-01-24 MED ORDER — ONDANSETRON HCL 4 MG/2ML IJ SOLN
INTRAMUSCULAR | Status: AC
  Filled 2017-01-24: qty 2

## 2017-01-24 MED ORDER — LIDOCAINE HCL (CARDIAC) 20 MG/ML IV SOLN
INTRAVENOUS | Status: DC | PRN
  Administered 2017-01-24: 60 mg via INTRAVENOUS

## 2017-01-24 MED ORDER — PROPOFOL 10 MG/ML IV BOLUS
INTRAVENOUS | Status: AC
  Filled 2017-01-24: qty 20

## 2017-01-24 MED ORDER — ONDANSETRON HCL 4 MG/2ML IJ SOLN
4.0000 mg | Freq: Once | INTRAMUSCULAR | Status: AC | PRN
  Administered 2017-01-24: 4 mg via INTRAVENOUS

## 2017-01-24 MED ORDER — SILVER NITRATE-POT NITRATE 75-25 % EX MISC
CUTANEOUS | Status: AC
  Filled 2017-01-24: qty 4

## 2017-01-24 MED ORDER — LACTATED RINGERS IV SOLN
INTRAVENOUS | Status: DC
  Administered 2017-01-24: 125 mL/h via INTRAVENOUS

## 2017-01-24 MED ORDER — MEPERIDINE HCL 50 MG/ML IJ SOLN
12.5000 mg | Freq: Once | INTRAMUSCULAR | Status: AC
  Administered 2017-01-24: 12.5 mg via INTRAVENOUS

## 2017-01-24 MED ORDER — MIDAZOLAM HCL 2 MG/2ML IJ SOLN
INTRAMUSCULAR | Status: AC
  Filled 2017-01-24: qty 2

## 2017-01-24 MED ORDER — FENTANYL CITRATE (PF) 100 MCG/2ML IJ SOLN
INTRAMUSCULAR | Status: AC
  Administered 2017-01-24: 25 ug via INTRAVENOUS
  Filled 2017-01-24: qty 2

## 2017-01-24 SURGICAL SUPPLY — 20 items
CATH ROBINSON RED A/P 16FR (CATHETERS) ×3 IMPLANT
FILTER UTR ASPR SPEC (MISCELLANEOUS) ×1 IMPLANT
FLTR UTR ASPR SPEC (MISCELLANEOUS) ×3
GLOVE BIO SURGEON STRL SZ8 (GLOVE) ×3 IMPLANT
GOWN STRL REUS W/ TWL LRG LVL3 (GOWN DISPOSABLE) ×1 IMPLANT
GOWN STRL REUS W/ TWL XL LVL3 (GOWN DISPOSABLE) ×1 IMPLANT
GOWN STRL REUS W/TWL LRG LVL3 (GOWN DISPOSABLE) ×2
GOWN STRL REUS W/TWL XL LVL3 (GOWN DISPOSABLE) ×2
KIT RM TURNOVER CYSTO AR (KITS) ×3 IMPLANT
PACK DNC HYST (MISCELLANEOUS) ×3 IMPLANT
PAD OB MATERNITY 4.3X12.25 (PERSONAL CARE ITEMS) ×3 IMPLANT
PAD PREP 24X41 OB/GYN DISP (PERSONAL CARE ITEMS) ×3 IMPLANT
SET BERKELEY SUCTION TUBING (SUCTIONS) ×3 IMPLANT
TOWEL OR 17X26 4PK STRL BLUE (TOWEL DISPOSABLE) ×3 IMPLANT
VACURETTE 10 RIGID CVD (CANNULA) IMPLANT
VACURETTE 6 ASPIR F TIP BERK (CANNULA) ×3 IMPLANT
VACURETTE 7MM F TIP (CANNULA)
VACURETTE 7MM F TIP STRL (CANNULA) IMPLANT
VACURETTE 8 RIGID CVD (CANNULA) ×3 IMPLANT
VACURETTE 8MM F TIP (MISCELLANEOUS) IMPLANT

## 2017-01-24 NOTE — Anesthesia Preprocedure Evaluation (Signed)
Anesthesia Evaluation  Patient identified by MRN, date of birth, ID band Patient awake    Reviewed: Allergy & Precautions, NPO status , Patient's Chart, lab work & pertinent test results  History of Anesthesia Complications (+) PONV and history of anesthetic complications  Airway Mallampati: II  TM Distance: >3 FB Neck ROM: Full    Dental no notable dental hx.    Pulmonary pneumonia,    Pulmonary exam normal breath sounds clear to auscultation       Cardiovascular negative cardio ROS Normal cardiovascular exam+ Valvular Problems/Murmurs  Rhythm:Regular Rate:Normal     Neuro/Psych PSYCHIATRIC DISORDERS Anxiety Depression negative neurological ROS     GI/Hepatic negative GI ROS, Neg liver ROS,   Endo/Other  negative endocrine ROS  Renal/GU   negative genitourinary   Musculoskeletal negative musculoskeletal ROS (+)   Abdominal   Peds negative pediatric ROS (+)  Hematology negative hematology ROS (+)   Anesthesia Other Findings Past Medical History: No date: Anxiety No date: Bronchitis     Comment: age 41 No date: Depression No date: Fibroids No date: Frequency of urination No date: Heart murmur No date: History of kidney stones No date: Pneumonia     Comment: viral and bacterial at age 11 No date: PONV (postoperative nausea and vomiting) No date: Renal disorder     Comment: kidney stones No date: Right flank pain No date: Right ureteral stone No date: Urgency of urination  Reproductive/Obstetrics negative OB ROS                             Anesthesia Physical  Anesthesia Plan  ASA: II  Anesthesia Plan: General   Post-op Pain Management:    Induction: Intravenous  PONV Risk Score and Plan:   Airway Management Planned: LMA  Additional Equipment:   Intra-op Plan:   Post-operative Plan: Extubation in OR  Informed Consent: I have reviewed the patients History and  Physical, chart, labs and discussed the procedure including the risks, benefits and alternatives for the proposed anesthesia with the patient or authorized representative who has indicated his/her understanding and acceptance.   Dental advisory given  Plan Discussed with: CRNA  Anesthesia Plan Comments:         Anesthesia Quick Evaluation

## 2017-01-24 NOTE — Progress Notes (Signed)
Ready for surgery  K+ supplement given  All questions answered  Suction D+C

## 2017-01-24 NOTE — Transfer of Care (Signed)
Immediate Anesthesia Transfer of Care Note  Patient: Beth Spence  Procedure(s) Performed: Procedure(s): DILATATION AND EVACUATION (N/A)  Patient Location: PACU  Anesthesia Type:General  Level of Consciousness: sedated  Airway & Oxygen Therapy: Patient Spontanous Breathing and Patient connected to face mask oxygen  Post-op Assessment: Report given to RN and Post -op Vital signs reviewed and stable  Post vital signs: Reviewed and stable  Last Vitals:  Vitals:   01/24/17 1043 01/24/17 1331  BP: 121/81 100/71  Pulse: 76 62  Resp: 16 17  Temp: 36.6 C 36.3 C    Last Pain:  Vitals:   01/24/17 1331  TempSrc: Tympanic         Complications: No apparent anesthesia complications

## 2017-01-24 NOTE — Anesthesia Procedure Notes (Signed)
Procedure Name: LMA Insertion Date/Time: 01/24/2017 1:01 PM Performed by: Hedda Slade Pre-anesthesia Checklist: Patient identified, Patient being monitored, Timeout performed, Emergency Drugs available and Suction available Patient Re-evaluated:Patient Re-evaluated prior to inductionOxygen Delivery Method: Circle system utilized Preoxygenation: Pre-oxygenation with 100% oxygen Intubation Type: IV induction Ventilation: Mask ventilation without difficulty LMA: LMA inserted LMA Size: 4.0 Tube type: Oral Number of attempts: 1 Placement Confirmation: positive ETCO2 and breath sounds checked- equal and bilateral Tube secured with: Tape Dental Injury: Teeth and Oropharynx as per pre-operative assessment

## 2017-01-24 NOTE — Anesthesia Post-op Follow-up Note (Cosign Needed)
Anesthesia QCDR form completed.        

## 2017-01-24 NOTE — Brief Op Note (Signed)
01/24/2017  1:22 PM  PATIENT:  Beth Spence  41 y.o. female  PRE-OPERATIVE DIAGNOSIS:  Missed Ab  POST-OPERATIVE DIAGNOSIS:  Missed Ab  PROCEDURE:  Procedure(s): DILATATION AND EVACUATION (N/A)  SURGEON:  Surgeon(s) and Role:    * Deago Burruss, Gwen Her, MD - Primary  PHYSICIAN ASSISTANT:   ASSISTANTS: none   ANESTHESIA:   general  EBL:  Total I/O In: -  Out: 75 [Urine:50; Blood:25] iof 650 cc BLOOD ADMINISTERED:none  DRAINS: none   LOCAL MEDICATIONS USED:  NONE  SPECIMEN:  Source of Specimen:  products of conception  DISPOSITION OF SPECIMEN:  PATHOLOGY  COUNTS:  YES  TOURNIQUET:  * No tourniquets in log *  DICTATION: .Other Dictation: Dictation Number verbal  PLAN OF CARE: Discharge to home after PACU  PATIENT DISPOSITION:  PACU - hemodynamically stable.   Delay start of Pharmacological VTE agent (>24hrs) due to surgical blood loss or risk of bleeding: not applicable

## 2017-01-24 NOTE — Discharge Instructions (Signed)

## 2017-01-24 NOTE — Anesthesia Postprocedure Evaluation (Signed)
Anesthesia Post Note  Patient: Beth Spence  Procedure(s) Performed: Procedure(s) (LRB): DILATATION AND EVACUATION (N/A)  Patient location during evaluation: PACU Anesthesia Type: General Level of consciousness: awake and alert and oriented Pain management: pain level controlled Vital Signs Assessment: post-procedure vital signs reviewed and stable Respiratory status: spontaneous breathing Cardiovascular status: blood pressure returned to baseline Anesthetic complications: no     Last Vitals:  Vitals:   01/24/17 1416 01/24/17 1433  BP: 109/71 (!) 98/58  Pulse: 63 (!) 57  Resp: 19 16  Temp: 36.7 C 36.9 C    Last Pain:  Vitals:   01/24/17 1433  TempSrc: Oral  PainSc:                  Abdurrahman Petersheim

## 2017-01-24 NOTE — Op Note (Signed)
NAME:  Beth Spence, Beth Spence                   ACCOUNT NO.:  MEDICAL RECORD NO.:  [id number]  LOCATION:                                 FACILITY:  PHYSICIAN:  Laverta Baltimore, MD     DATE OF BIRTH:  DATE OF PROCEDURE: DATE OF DISCHARGE:                              OPERATIVE REPORT   PREOPERATIVE DIAGNOSIS:  Missed abortion.  POSTOPERATIVE DIAGNOSIS:  Missed abortion.  PROCEDURE PERFORMED:  Suction dilation and curettage.  SURGEON:  Laverta Baltimore, MD  ANESTHESIA:  General endotracheal anesthesia.  SURGEON:  Laverta Baltimore, MD.  INDICATIONS:  This is a 41 year old, gravida 4, para 2 patient with elevated beta hCG of 91,000 and 97,000 with ultrasound findings consistent with missed abortion.  DESCRIPTION OF PROCEDURE:  After adequate general endotracheal anesthesia, the patient was placed in dorsal supine position with the legs in the candy-cane stirrups.  The patient's abdomen was prepped and draped in normal sterile fashion.  The vagina was prepped in a normal sterile fashion.  Time-out was performed.  Straight catheterization of the bladder yielded 50 mL clear urine.  A speculum was used to identify the cervix and a single-tooth tenaculum was applied on the anterior cervix.  The cervix was then dilated to #20 Hanks dilator without difficulty.  A #8 flexible suction curette was then placed into the endometrial cavity and a moderate amount of tissue consistent with products of conception were removed.  Sharp curettage was performed with good uterine cry and a repeat flexible suction curette was performed with no additional tissue.  Good hemostasis was noted.  Single-tooth tenaculum was removed.  There were no complications.  ESTIMATED BLOOD LOSS:  25 mL.  INTRAOPERATIVE FLUIDS:  650 mL.  The patient was taken to the recovery room in good condition.          ______________________________ Laverta Baltimore, MD     TS/MEDQ  D:  01/24/2017  T:   01/24/2017  Job:  [id number]

## 2017-01-25 ENCOUNTER — Encounter: Payer: Self-pay | Admitting: Obstetrics and Gynecology

## 2017-01-25 LAB — SURGICAL PATHOLOGY

## 2017-02-01 ENCOUNTER — Ambulatory Visit: Payer: BLUE CROSS/BLUE SHIELD

## 2017-02-12 ENCOUNTER — Ambulatory Visit: Payer: Self-pay | Admitting: Obstetrics and Gynecology

## 2018-01-29 ENCOUNTER — Other Ambulatory Visit: Payer: Self-pay | Admitting: Obstetrics and Gynecology

## 2018-01-29 DIAGNOSIS — Z1231 Encounter for screening mammogram for malignant neoplasm of breast: Secondary | ICD-10-CM

## 2019-02-07 ENCOUNTER — Other Ambulatory Visit: Payer: Self-pay | Admitting: Obstetrics and Gynecology

## 2019-02-07 DIAGNOSIS — Z1231 Encounter for screening mammogram for malignant neoplasm of breast: Secondary | ICD-10-CM

## 2019-02-12 ENCOUNTER — Ambulatory Visit
Admission: RE | Admit: 2019-02-12 | Discharge: 2019-02-12 | Disposition: A | Discharge status: Home / Self Care | Payer: BC Managed Care – PPO | Source: Ambulatory Visit | Attending: Obstetrics and Gynecology | Admitting: Obstetrics and Gynecology

## 2019-02-12 ENCOUNTER — Other Ambulatory Visit: Payer: Self-pay

## 2019-02-12 DIAGNOSIS — Z1231 Encounter for screening mammogram for malignant neoplasm of breast: Secondary | ICD-10-CM | POA: Diagnosis present

## 2021-06-06 ENCOUNTER — Other Ambulatory Visit: Payer: Self-pay | Admitting: Family Medicine

## 2021-06-06 DIAGNOSIS — Z1231 Encounter for screening mammogram for malignant neoplasm of breast: Secondary | ICD-10-CM

## 2021-07-20 ENCOUNTER — Other Ambulatory Visit: Payer: Self-pay | Admitting: General Surgery

## 2021-07-20 NOTE — Progress Notes (Signed)
Subjective:     Patient ID: Beth Spence is a 45 y.o. female.   HPI   The following portions of the patient's history were reviewed and updated as appropriate.   This a new patient is here today for: office visit. The patient is here today to discuss having a colonoscopy. She reports her bowels move every 3 days. Patient denies any rectal bleeding, rectal pain, or mucus. She has not had a prior colonoscopy.   The patient reports she moves her bowels about every 3 days.  On days she does not move her bowels she is not symptomatic.  This has been a lifelong pattern.   No history of blood per rectum.   Her maternal grandmother had colorectal cancer in her late 69s.  Maternal uncle an unknown age.   Her mother has been a patient of mine in the past.    The patient had difficulty with multiple episodes of nephrolithiasis from age 52 until about age 62.  After dietary change based on stone analysis she has been symptom-free for 7 years.      Chief Complaint  Patient presents with   Pre-op Exam      BP 118/64    Pulse 78    Temp 36.7 C (98.1 F)    Ht 154.9 cm (5' 1")    Wt 52.2 kg (115 lb)    LMP 06/29/2021    SpO2 99%    BMI 21.73 kg/m        Past Medical History:  Diagnosis Date   Depression     Fibroids     Kidney stones     Murmur, heart, unspecified     PONV (postoperative nausea and vomiting)             Past Surgical History:  Procedure Laterality Date   DILATION AND CURETTAGE OF UTERUS   2008   LITHOTRIPSY   2012   CYSTOSCOPY   06/02/2012   CYSTOSCOPY   10/02/2014   DILATION AND CURETTAGE, DIAGNOSTIC / THERAPEUTIC   01/24/2017   CESAREAN SECTION       CYSTOSCOPY W/ URETEROSCOPY W/ LITHOTRIPSY        1999, 2001, 2003   jaw surgery                    OB History     Gravida  4   Para  2   Term  2   Preterm      AB  1   Living  2      SAB  1   IAB      Ectopic      Molar      Multiple      Live Births  2        Obstetric Comments   Age at first period 50 Age of first pregnancy 84             Social History           Socioeconomic History   Marital status: Married  Tobacco Use   Smoking status: Never   Smokeless tobacco: Never  Vaping Use   Vaping Use: Never used  Substance and Sexual Activity   Alcohol use: Yes      Comment: one drink per one to two weeks   Drug use: No   Sexual activity: Yes      Partners: Male      Birth control/protection: None  Comment: vasectomy             Allergies  Allergen Reactions   Penicillin Anaphylaxis and Hives   Cephalexin Hives and Other (See Comments)      MOUTH BLISTERS   Oxycodone-Acetaminophen Hives      Percocet Has patient had a PCN reaction causing immediate rash, facial/tongue/throat swelling, SOB or lightheadedness with hypotension: Unknown Has patient had a PCN reaction causing severe rash involving mucus membranes or skin necrosis: Unknown Has patient had a PCN reaction that required hospitalization: No Has patient had a PCN reaction occurring within the last 10 years: No If all of the above answers are "NO", then may proceed with Cephalosporin use. Childhood reaction.   Propoxyphene N-Acetaminophen Hives      Darvocet   Sulfa (Sulfonamide Antibiotics) Hives and Other (See Comments)      Mouth blisters/  ANY SULFADERIVATIVES - MOUTH BLISTERS   Sulfamethoxazole-Trimethoprim Hives and Other (See Comments)      MOUTH BLISTERS, Septra      Current Medications        Current Outpatient Medications  Medication Sig Dispense Refill   indapamide (LOZOL) 2.5 MG tablet Take 1 tablet (2.5 mg total) by mouth once daily       lamoTRIgine (LAMICTAL) 200 MG tablet Take 1 tablet (200 mg total) by mouth once daily       QUEtiapine (SEROQUEL XR) 150 mg XR tablet Take 1 tablet (150 mg total) by mouth at bedtime       QUEtiapine (SEROQUEL) 100 MG tablet Take 1.5 tablets (150 mg total) by mouth at bedtime       ibuprofen (ADVIL,MOTRIN) 200 MG tablet Take by  mouth. (Patient not taking: Reported on 06/06/2021)       INDAPAMIDE ORAL Take by mouth. (Patient not taking: Reported on 06/06/2021)       triamcinolone acetonide (ORALONE) 0.1 % paste APPLY TO AFFECTED AREA 2-3 TIMES DAILY AS NEEDED. (Patient not taking: Reported on 06/06/2021)        No current facility-administered medications for this visit.             Family History  Problem Relation Age of Onset   Uterine cancer Maternal Grandmother     Ovarian cancer Maternal Grandmother     Rectal cancer Maternal Grandmother     Stomach cancer Maternal Grandmother     Heart disease Paternal Grandmother     Liver disease Paternal Grandfather     Colon cancer Maternal Uncle     Breast cancer Neg Hx          Labs and Radiology:    June 06, 2021 laboratory:   Glucose 70 - 110 mg/dL 88   Sodium 136 - 145 mmol/L 140   Potassium 3.6 - 5.1 mmol/L 3.0 Low    Chloride 97 - 109 mmol/L 99   Carbon Dioxide (CO2) 22.0 - 32.0 mmol/L 32.7 High    Urea Nitrogen (BUN) 7 - 25 mg/dL 13   Creatinine 0.6 - 1.1 mg/dL 0.9   Glomerular Filtration Rate (eGFR), MDRD Estimate >60 mL/min/1.73sq m 67   Calcium 8.7 - 10.3 mg/dL 9.6   AST  8 - 39 U/L 17   ALT  5 - 38 U/L 13   Alk Phos (alkaline Phosphatase) 34 - 104 U/L 57   Albumin 3.5 - 4.8 g/dL 4.6   Bilirubin, Total 0.3 - 1.2 mg/dL 0.9   Protein, Total 6.1 - 7.9 g/dL 6.7   A/G Ratio 1.0 -  5.0 gm/dL 2.2       WBC (White Blood Cell Count) 4.1 - 10.2 103/uL 4.7   RBC (Red Blood Cell Count) 4.04 - 5.48 106/uL 4.21   Hemoglobin 12.0 - 15.0 gm/dL 11.2 Low    Hematocrit 35.0 - 47.0 % 34.5 Low    MCV (Mean Corpuscular Volume) 80.0 - 100.0 fl 81.9   MCH (Mean Corpuscular Hemoglobin) 27.0 - 31.2 pg 26.6 Low    MCHC (Mean Corpuscular Hemoglobin Concentration) 32.0 - 36.0 gm/dL 32.5   Platelet Count 150 - 450 103/uL 328   RDW-CV (Red Cell Distribution Width) 11.6 - 14.8 % 15.7 High    MPV (Mean Platelet Volume) 9.4 - 12.4 fl 9.9   Neutrophils 1.50 - 7.80  103/uL 2.61   Lymphocytes 1.00 - 3.60 103/uL 1.57   Monocytes 0.00 - 1.50 103/uL 0.32   Eosinophils 0.00 - 0.55 103/uL 0.13   Basophils 0.00 - 0.09 103/uL 0.07   Neutrophil % 32.0 - 70.0 % 55.4   Lymphocyte % 10.0 - 50.0 % 33.3   Monocyte % 4.0 - 13.0 % 6.8   Eosinophil % 1.0 - 5.0 % 2.8   Basophil% 0.0 - 2.0 % 1.5   Immature Granulocyte % <=0.7 % 0.2   Immature Granulocyte Count <=0.06 10^3/L 0.01       Component Ref Range & Units 1 mo ago 1 yr ago 4 yr ago   WBC (White Blood Cell Count) 4.1 - 10.2 103/uL 4.7  5.1  7.4    RBC (Red Blood Cell Count) 4.04 - 5.48 106/uL 4.21  4.35  4.45    Hemoglobin 12.0 - 15.0 gm/dL 11.2 Low   11.8 Low   12.4    Hematocrit 35.0 - 47.0 % 34.5 Low   36.9  36.5    MCV (Mean Corpuscular Volume) 80.0 - 100.0 fl 81.9  84.8  82.0                 Review of Systems  Constitutional: Negative for chills and fever.  Respiratory: Negative for cough.          Objective:   Physical Exam Exam conducted with a chaperone present.  Constitutional:      Appearance: Normal appearance.  Cardiovascular:     Rate and Rhythm: Normal rate and regular rhythm.     Pulses: Normal pulses.     Heart sounds: Normal heart sounds.  Pulmonary:     Effort: Pulmonary effort is normal.     Breath sounds: Normal breath sounds.  Musculoskeletal:     Cervical back: Neck supple.  Skin:    General: Skin is warm and dry.  Neurological:     Mental Status: She is alert and oriented to person, place, and time.  Psychiatric:        Mood and Affect: Mood normal.        Behavior: Behavior normal.           Assessment:     Candidate for colon cancer screening.   Minimal change in hemoglobin/hematocrit over the last several years.  Normocytic indices.   Mildly low serum potassium likely secondary to diuretic therapy.    Plan:     Colon cancer screening options reviewed: 1) Cologuard versus 2, colonoscopy.  Pros and cons of each including risks associated with  colonoscopy reviewed.   Patient desires to proceed with colonoscopy.  She was instructed in regards to preparation by the staff.   Will ask her to follow-up with  her PCP in regards to her mildly low potassium.  Supplementation may be appropriate.    This note is partially prepared by Ledell Noss, CMA acting as a scribe in the presence of Dr. Hervey Ard, MD.    The documentation recorded by the scribe accurately reflects the service I personally performed and the decisions made by me.    Robert Bellow, MD FACS

## 2021-08-23 ENCOUNTER — Encounter: Payer: Self-pay | Admitting: General Surgery

## 2021-08-24 ENCOUNTER — Ambulatory Visit: Payer: No Typology Code available for payment source | Admitting: Anesthesiology

## 2021-08-24 ENCOUNTER — Ambulatory Visit: Payer: No Typology Code available for payment source

## 2021-08-24 ENCOUNTER — Encounter
Admission: RE | Disposition: A | Discharge status: Home / Self Care | Payer: Self-pay | Source: Home / Self Care | Attending: General Surgery

## 2021-08-24 ENCOUNTER — Encounter: Payer: Self-pay | Admitting: General Surgery

## 2021-08-24 ENCOUNTER — Ambulatory Visit
Admission: RE | Admit: 2021-08-24 | Discharge: 2021-08-24 | Disposition: A | Discharge status: Home / Self Care | Payer: No Typology Code available for payment source | Attending: General Surgery | Admitting: General Surgery

## 2021-08-24 DIAGNOSIS — Q438 Other specified congenital malformations of intestine: Secondary | ICD-10-CM | POA: Diagnosis not present

## 2021-08-24 DIAGNOSIS — Z1211 Encounter for screening for malignant neoplasm of colon: Secondary | ICD-10-CM | POA: Diagnosis present

## 2021-08-24 DIAGNOSIS — N289 Disorder of kidney and ureter, unspecified: Secondary | ICD-10-CM | POA: Diagnosis not present

## 2021-08-24 DIAGNOSIS — Z800 Family history of malignant neoplasm of digestive organs: Secondary | ICD-10-CM | POA: Diagnosis not present

## 2021-08-24 HISTORY — PX: COLONOSCOPY WITH PROPOFOL: SHX5780

## 2021-08-24 LAB — POCT PREGNANCY, URINE: Preg Test, Ur: NEGATIVE

## 2021-08-24 SURGERY — COLONOSCOPY WITH PROPOFOL
Anesthesia: General

## 2021-08-24 MED ORDER — SODIUM CHLORIDE 0.9 % IV SOLN
INTRAVENOUS | Status: DC
  Administered 2021-08-24: 11:00:00 1000 mL via INTRAVENOUS

## 2021-08-24 MED ORDER — PROPOFOL 500 MG/50ML IV EMUL
INTRAVENOUS | Status: DC | PRN
  Administered 2021-08-24: 160 ug/kg/min via INTRAVENOUS

## 2021-08-24 MED ORDER — PROPOFOL 10 MG/ML IV BOLUS
INTRAVENOUS | Status: DC | PRN
  Administered 2021-08-24: 50 mg via INTRAVENOUS

## 2021-08-24 MED ORDER — SODIUM CHLORIDE 0.9 % IV SOLN: INTRAVENOUS | Status: DC

## 2021-08-24 NOTE — Transfer of Care (Signed)
Immediate Anesthesia Transfer of Care Note  Patient: Jacquie Lukes  Procedure(s) Performed: COLONOSCOPY WITH PROPOFOL  Patient Location: PACU  Anesthesia Type:General  Level of Consciousness: drowsy  Airway & Oxygen Therapy: Patient Spontanous Breathing  Post-op Assessment: Report given to RN and Post -op Vital signs reviewed and stable  Post vital signs: Reviewed and stable  Last Vitals:  Vitals Value Taken Time  BP 93/61 08/24/21 1104  Temp    Pulse 74 08/24/21 1105  Resp 14 08/24/21 1105  SpO2 98 % 08/24/21 1105  Vitals shown include unvalidated device data.  Last Pain:  Vitals:   08/24/21 1025  TempSrc: Temporal  PainSc: 0-No pain         Complications: No notable events documented.

## 2021-08-24 NOTE — Op Note (Signed)
[facility] Gastroenterology Patient Name: Beth Spence Procedure Date: 08/24/2021 10:10 AM MRN: [id number] Account #: [id number] Date of Birth: Nov 26, 1975 Admit Type: Outpatient Age: 46 Room: [facility] ENDO ROOM 1 Gender: Female Note Status: Finalized Instrument Name: Peds Colonoscope [id number] Procedure:             Colonoscopy Indications:           Screening for colorectal malignant neoplasm Providers:             Robert Bellow, MD Referring MD:          No PCP Medicines:             Propofol per Anesthesia Complications:         No immediate complications. Procedure:             Pre-Anesthesia Assessment:                        - Prior to the procedure, a History and Physical was                         performed, and patient medications, allergies and                         sensitivities were reviewed. The patient's tolerance                         of previous anesthesia was reviewed.                        - The risks and benefits of the procedure and the                         sedation options and risks were discussed with the                         patient. All questions were answered and informed                         consent was obtained.                        After obtaining informed consent, the colonoscope was                         passed under direct vision. Throughout the procedure,                         the patient's blood pressure, pulse, and oxygen                         saturations were monitored continuously. The                         Colonoscope was introduced through the anus and                         advanced to the the cecum, identified by appendiceal                         orifice  and ileocecal valve. The colonoscopy was                         somewhat difficult due to significant looping.                         Successful completion of the procedure was aided by                         using manual pressure. The  patient tolerated the                         procedure well. The quality of the bowel preparation                         was excellent. Findings:      The entire examined colon appeared normal on direct and retroflexion       views. Impression:            - The entire examined colon is normal on direct and                         retroflexion views.                        - No specimens collected. Recommendation:        - Repeat colonoscopy in 10 years for screening                         purposes. Procedure Code(s):     --- Professional ---                        [phone], Colonoscopy, flexible; diagnostic, including                         collection of specimen(s) by brushing or washing, when                         performed (separate procedure) Diagnosis Code(s):     --- Professional ---                        Z12.11, Encounter for screening for malignant neoplasm                         of colon CPT copyright 2019 [employer]. All rights reserved. The codes documented in this report are preliminary and upon coder review may  be revised to meet current compliance requirements. Robert Bellow, MD 08/24/2021 11:03:14 AM This report has been signed electronically. Number of Addenda: 0 Note Initiated On: 08/24/2021 10:10 AM Scope Withdrawal Time: 0 hours 10 minutes 8 seconds  Total Procedure Duration: 0 hours 24 minutes 53 seconds  Estimated Blood Loss:  Estimated blood loss: none.      [facility]

## 2021-08-24 NOTE — Anesthesia Procedure Notes (Signed)
Date/Time: 08/24/2021 10:36 AM Performed by: Demetrius Charity, CRNA Pre-anesthesia Checklist: Patient identified, Patient being monitored, Timeout performed, Emergency Drugs available and Suction available Patient Re-evaluated:Patient Re-evaluated prior to induction Oxygen Delivery Method: Nasal cannula Placement Confirmation: CO2 detector and positive ETCO2 Dental Injury: Teeth and Oropharynx as per pre-operative assessment

## 2021-08-24 NOTE — Anesthesia Postprocedure Evaluation (Signed)
Anesthesia Post Note  Patient: Beth Spence  Procedure(s) Performed: COLONOSCOPY WITH PROPOFOL  Patient location during evaluation: Endoscopy Anesthesia Type: General Level of consciousness: awake and alert Pain management: pain level controlled Vital Signs Assessment: post-procedure vital signs reviewed and stable Respiratory status: spontaneous breathing, nonlabored ventilation, respiratory function stable and patient connected to nasal cannula oxygen Cardiovascular status: blood pressure returned to baseline and stable Postop Assessment: no apparent nausea or vomiting Anesthetic complications: no   No notable events documented.   Last Vitals:  Vitals:   08/24/21 1130 08/24/21 1134  BP:  99/74  Pulse: 72 66  Resp: 12 11  Temp:    SpO2: 100% 100%    Last Pain:  Vitals:   08/24/21 1104  TempSrc: Temporal  PainSc:                  Precious Haws [place] Remus

## 2021-08-24 NOTE — H&P (Signed)
Beth Spence [id number] 07-23-1976     HPI: Healthy 46 y/o woman with a family history of colon cancer in second degree relatives.  For screening procedure. Tolerated the prep well.   Medications Prior to Admission  Medication Sig Dispense Refill Last Dose   indapamide (LOZOL) 2.5 MG tablet Take 2.5 mg by mouth at bedtime.   08/23/2021   lamoTRIgine (LAMICTAL) 200 MG tablet Take 200 mg by mouth at bedtime.    08/23/2021   QUEtiapine Fumarate (SEROQUEL XR) 150 MG 24 hr tablet Take 150 mg by mouth at bedtime.   08/23/2021   ibuprofen (ADVIL,MOTRIN) 200 MG tablet Take 400 mg by mouth every 8 (eight) hours as needed (for pain.).     at PRN   Allergies  Allergen Reactions   Penicillins Anaphylaxis and Hives   Amoxil [Amoxicillin] Hives    Has patient had a PCN reaction causing immediate rash, facial/tongue/throat swelling, SOB or lightheadedness with hypotension: Yes Has patient had a PCN reaction causing severe rash involving mucus membranes or skin necrosis: No Has patient had a PCN reaction that required hospitalization: No Has patient had a PCN reaction occurring within the last 10 years: No If all of the above answers are "NO", then may proceed with Cephalosporin use.    Cephalexin Hives and Other (See Comments)    MOUTH BLISTERS   Darvocet [Propoxyphene N-Acetaminophen] Hives   Percocet [Oxycodone-Acetaminophen] Hives    Has patient had a PCN reaction causing immediate rash, facial/tongue/throat swelling, SOB or lightheadedness with hypotension: Unknown Has patient had a PCN reaction causing severe rash involving mucus membranes or skin necrosis: Unknown Has patient had a PCN reaction that required hospitalization: No Has patient had a PCN reaction occurring within the last 10 years: No If all of the above answers are "NO", then may proceed with Cephalosporin use. Childhood reaction.   Septra [Sulfamethoxazole-Trimethoprim] Hives and Other (See Comments)    MOUTH BLISTERS   Sulfa  Antibiotics Hives and Other (See Comments)    Mouth blisters/  ANY SULFADERIVATIVES - MOUTH BLISTERS   Past Medical History:  Diagnosis Date   Anxiety    Bronchitis    age 56   Depression    Fibroids    Frequency of urination    Heart murmur    History of kidney stones    Pneumonia    viral and bacterial at age 86   PONV (postoperative nausea and vomiting)    Renal disorder    kidney stones   Right flank pain    Right ureteral stone    Urgency of urination    Past Surgical History:  Procedure Laterality Date   CESAREAN SECTION  2006  &  2009   CYSTOSCOPY WITH RETROGRADE PYELOGRAM, URETEROSCOPY AND STENT PLACEMENT  06/02/2012   Procedure: CYSTOSCOPY WITH RETROGRADE PYELOGRAM, URETEROSCOPY AND STENT PLACEMENT;  Surgeon: Molli Hazard, MD;  Location: [facility];  Service: Urology;  Laterality: Right;   CYSTOSCOPY WITH RETROGRADE PYELOGRAM, URETEROSCOPY AND STENT PLACEMENT Bilateral 10/02/2014   Procedure: CYSTOSCOPY, BILATERAL RETROGRADE PYELOGRAM, BILATERAL DIAGNOSTIC URETEROSCOPY AND BILATERAL STENT PLACEMENT;  Surgeon: Phebe Colla, MD;  Location: [facility];  Service: Urology;  Laterality: Bilateral;   DILATATION AND CURRETAGE WITH SUCTION  2008   MISSED AB   DILATION AND CURETTAGE OF UTERUS     DILATION AND EVACUATION N/A 01/24/2017   Procedure: DILATATION AND EVACUATION;  Surgeon: Boykin Nearing, MD;  Location: [facility];  Service: Gynecology;  Laterality: N/A;   EXTRACORPOREAL SHOCK WAVE  LITHOTRIPSY  2012   FRACTURE SURGERY     ORIF BILATERAL JAW FX'S  1991   titanium screws in jaw   URETEROSCOPIC STONE EXTRACTIONS  2003;  2001  X3;  1999  X1   Social History   Socioeconomic History   Marital status: Married    Spouse name: Not on file   Number of children: Not on file   Years of education: Not on file   Highest education level: Not on file  Occupational History   Not on file  Tobacco Use   Smoking status: Never   Smokeless tobacco: Never  Vaping Use    Vaping Use: Never used  Substance and Sexual Activity   Alcohol use: Yes    Alcohol/week: 1.0 - 2.0 standard drink    Types: 1 - 2 Standard drinks or equivalent per week   Drug use: No   Sexual activity: Yes  Other Topics Concern   Not on file  Social History Narrative   Not on file   Social Determinants of Health   Financial Resource Strain: Not on file  Food Insecurity: Not on file  Transportation Needs: Not on file  Physical Activity: Not on file  Stress: Not on file  Social Connections: Not on file  Intimate Partner Violence: Not on file   Social History   Social History Narrative   Not on file     ROS: Negative.     PE: HEENT: Negative. Lungs: Clear. Cardio: RR.  Assessment/Plan:  Proceed with planned screening colonoscopy.    Forest Gleason [facility] 08/24/2021

## 2021-08-24 NOTE — Anesthesia Preprocedure Evaluation (Signed)
Anesthesia Evaluation  Patient identified by MRN, date of birth, ID band Patient awake    Reviewed: Allergy & Precautions, NPO status , Patient's Chart, lab work & pertinent test results  History of Anesthesia Complications (+) PONV and history of anesthetic complications  Airway Mallampati: III  TM Distance: >3 FB Neck ROM: full    Dental  (+) Chipped   Pulmonary neg shortness of breath,    Pulmonary exam normal        Cardiovascular (-) angina(-) Past MI negative cardio ROS Normal cardiovascular exam     Neuro/Psych PSYCHIATRIC DISORDERS negative neurological ROS     GI/Hepatic negative GI ROS, Neg liver ROS, neg GERD  ,  Endo/Other  negative endocrine ROS  Renal/GU Renal disease  negative genitourinary   Musculoskeletal   Abdominal   Peds  Hematology negative hematology ROS (+)   Anesthesia Other Findings Past Medical History: No date: Anxiety No date: Bronchitis     Comment:  age 46 No date: Depression No date: Fibroids No date: Frequency of urination No date: Heart murmur No date: History of kidney stones No date: Pneumonia     Comment:  viral and bacterial at age 46 No date: PONV (postoperative nausea and vomiting) No date: Renal disorder     Comment:  kidney stones No date: Right flank pain No date: Right ureteral stone No date: Urgency of urination  Past Surgical History: 2006  &  2009: CESAREAN SECTION 06/02/2012: CYSTOSCOPY WITH RETROGRADE PYELOGRAM, URETEROSCOPY AND  STENT PLACEMENT     Comment:  Procedure: CYSTOSCOPY WITH RETROGRADE PYELOGRAM,               URETEROSCOPY AND STENT PLACEMENT;  Surgeon: Molli Hazard, MD;  Location: [facility];  Service: Urology;                Laterality: Right; 10/02/2014: CYSTOSCOPY WITH RETROGRADE PYELOGRAM, URETEROSCOPY AND  STENT PLACEMENT; Bilateral     Comment:  Procedure: CYSTOSCOPY, BILATERAL RETROGRADE PYELOGRAM,                BILATERAL DIAGNOSTIC URETEROSCOPY AND BILATERAL STENT               PLACEMENT;  Surgeon: Phebe Colla, MD;  Location: [facility];                Service: Urology;  Laterality: Bilateral; 2008: DILATATION AND CURRETAGE WITH SUCTION     Comment:  MISSED AB No date: DILATION AND CURETTAGE OF UTERUS 01/24/2017: DILATION AND EVACUATION; N/A     Comment:  Procedure: DILATATION AND EVACUATION;  Surgeon:               Boykin Nearing, MD;  Location: [facility];                Service: Gynecology;  Laterality: N/A; 2012: EXTRACORPOREAL SHOCK WAVE LITHOTRIPSY No date: FRACTURE SURGERY 1991: ORIF BILATERAL JAW FX'S     Comment:  titanium screws in jaw 2003;  2001  X3;  1999  X1: URETEROSCOPIC STONE EXTRACTIONS     Reproductive/Obstetrics negative OB ROS                             Anesthesia Physical Anesthesia Plan  ASA: 2  Anesthesia Plan: General   Post-op Pain Management:    Induction: Intravenous  PONV Risk Score and Plan: Propofol  infusion and TIVA  Airway Management Planned: Natural Airway and Nasal Cannula  Additional Equipment:   Intra-op Plan:   Post-operative Plan:   Informed Consent: I have reviewed the patients History and Physical, chart, labs and discussed the procedure including the risks, benefits and alternatives for the proposed anesthesia with the patient or authorized representative who has indicated his/her understanding and acceptance.     Dental Advisory Given  Plan Discussed with: Anesthesiologist, CRNA and Surgeon  Anesthesia Plan Comments: (Patient consented for risks of anesthesia including but not limited to:  - adverse reactions to medications - risk of airway placement if required - damage to eyes, teeth, lips or other oral mucosa - nerve damage due to positioning  - sore throat or hoarseness - Damage to heart, brain, nerves, lungs, other parts of body or loss of life  Patient voiced understanding.)         Anesthesia Quick Evaluation

## 2021-08-25 ENCOUNTER — Encounter: Payer: Self-pay | Admitting: General Surgery

## 2021-10-11 ENCOUNTER — Other Ambulatory Visit: Payer: Self-pay

## 2021-10-11 ENCOUNTER — Ambulatory Visit
Admission: RE | Admit: 2021-10-11 | Discharge: 2021-10-11 | Disposition: A | Discharge status: Home / Self Care | Payer: No Typology Code available for payment source | Source: Ambulatory Visit | Attending: Family Medicine | Admitting: Family Medicine

## 2021-10-11 DIAGNOSIS — Z1231 Encounter for screening mammogram for malignant neoplasm of breast: Secondary | ICD-10-CM | POA: Insufficient documentation

## 2022-11-16 ENCOUNTER — Other Ambulatory Visit: Payer: Self-pay | Admitting: Family Medicine

## 2022-11-16 DIAGNOSIS — Z1231 Encounter for screening mammogram for malignant neoplasm of breast: Secondary | ICD-10-CM

## 2022-12-15 ENCOUNTER — Ambulatory Visit
Admission: RE | Admit: 2022-12-15 | Discharge: 2022-12-15 | Disposition: A | Discharge status: Home / Self Care | Payer: No Typology Code available for payment source | Source: Ambulatory Visit | Attending: Family Medicine | Admitting: Family Medicine

## 2022-12-15 DIAGNOSIS — Z1231 Encounter for screening mammogram for malignant neoplasm of breast: Secondary | ICD-10-CM | POA: Insufficient documentation

## 2023-01-31 ENCOUNTER — Other Ambulatory Visit: Payer: Self-pay | Admitting: Family Medicine

## 2023-01-31 DIAGNOSIS — N644 Mastodynia: Secondary | ICD-10-CM
